# Patient Record
Sex: Male | Born: 1935 | Race: White | Hispanic: No | Marital: Married | Smoking: Former smoker
Health system: Southern US, Community
[De-identification: ages and names within clinical notes are randomized; demographics above are authoritative.]

## PROBLEM LIST (undated history)

## (undated) DIAGNOSIS — N401 Enlarged prostate with lower urinary tract symptoms: Secondary | ICD-10-CM

## (undated) DIAGNOSIS — R350 Frequency of micturition: Secondary | ICD-10-CM

## (undated) DIAGNOSIS — R351 Nocturia: Secondary | ICD-10-CM

## (undated) DIAGNOSIS — R51 Headache: Secondary | ICD-10-CM

## (undated) DIAGNOSIS — N200 Calculus of kidney: Secondary | ICD-10-CM

## (undated) DIAGNOSIS — E785 Hyperlipidemia, unspecified: Secondary | ICD-10-CM

## (undated) DIAGNOSIS — N138 Other obstructive and reflux uropathy: Secondary | ICD-10-CM

## (undated) DIAGNOSIS — R3915 Urgency of urination: Secondary | ICD-10-CM

## (undated) DIAGNOSIS — C450 Mesothelioma of pleura: Secondary | ICD-10-CM

## (undated) DIAGNOSIS — IMO0001 Reserved for inherently not codable concepts without codable children: Secondary | ICD-10-CM

## (undated) DIAGNOSIS — Z85118 Personal history of other malignant neoplasm of bronchus and lung: Secondary | ICD-10-CM

## (undated) DIAGNOSIS — K219 Gastro-esophageal reflux disease without esophagitis: Secondary | ICD-10-CM

## (undated) DIAGNOSIS — G8929 Other chronic pain: Secondary | ICD-10-CM

## (undated) DIAGNOSIS — R0602 Shortness of breath: Secondary | ICD-10-CM

## (undated) HISTORY — DX: Other chronic pain: G89.29

## (undated) HISTORY — DX: Calculus of kidney: N20.0

## (undated) HISTORY — DX: Gastro-esophageal reflux disease without esophagitis: K21.9

## (undated) HISTORY — DX: Headache: R51

---

## 1992-03-23 HISTORY — PX: KNEE ARTHROSCOPY: SUR90

## 1999-04-03 ENCOUNTER — Ambulatory Visit (HOSPITAL_COMMUNITY): Admission: RE | Admit: 1999-04-03 | Discharge: 1999-04-03 | Payer: Self-pay | Admitting: *Deleted

## 2002-02-08 ENCOUNTER — Ambulatory Visit (HOSPITAL_COMMUNITY): Admission: RE | Admit: 2002-02-08 | Discharge: 2002-02-08 | Payer: Self-pay | Admitting: *Deleted

## 2005-08-06 ENCOUNTER — Ambulatory Visit (HOSPITAL_COMMUNITY): Admission: RE | Admit: 2005-08-06 | Discharge: 2005-08-06 | Payer: Self-pay | Admitting: Family Medicine

## 2005-08-12 ENCOUNTER — Ambulatory Visit (HOSPITAL_COMMUNITY): Admission: RE | Admit: 2005-08-12 | Discharge: 2005-08-12 | Payer: Self-pay | Admitting: Family Medicine

## 2005-08-19 ENCOUNTER — Ambulatory Visit (HOSPITAL_COMMUNITY): Admission: RE | Admit: 2005-08-19 | Discharge: 2005-08-19 | Payer: Self-pay | Admitting: Family Medicine

## 2005-08-19 ENCOUNTER — Encounter (INDEPENDENT_AMBULATORY_CARE_PROVIDER_SITE_OTHER): Payer: Self-pay | Admitting: *Deleted

## 2005-09-15 ENCOUNTER — Ambulatory Visit (HOSPITAL_COMMUNITY): Admission: RE | Admit: 2005-09-15 | Discharge: 2005-09-15 | Payer: Self-pay | Admitting: Thoracic Surgery

## 2005-09-18 HISTORY — PX: THORACENTESIS: SHX235

## 2005-09-21 ENCOUNTER — Inpatient Hospital Stay (HOSPITAL_COMMUNITY): Admission: RE | Admit: 2005-09-21 | Discharge: 2005-09-23 | Payer: Self-pay | Admitting: Thoracic Surgery

## 2005-09-21 ENCOUNTER — Encounter (INDEPENDENT_AMBULATORY_CARE_PROVIDER_SITE_OTHER): Payer: Self-pay | Admitting: Specialist

## 2005-09-21 HISTORY — PX: OTHER SURGICAL HISTORY: SHX169

## 2005-09-29 ENCOUNTER — Encounter: Admission: RE | Admit: 2005-09-29 | Discharge: 2005-09-29 | Payer: Self-pay | Admitting: Thoracic Surgery

## 2006-01-21 HISTORY — PX: OTHER SURGICAL HISTORY: SHX169

## 2006-09-13 ENCOUNTER — Ambulatory Visit (HOSPITAL_COMMUNITY): Admission: RE | Admit: 2006-09-13 | Discharge: 2006-09-13 | Payer: Self-pay | Admitting: Gastroenterology

## 2006-09-16 ENCOUNTER — Ambulatory Visit (HOSPITAL_COMMUNITY): Admission: RE | Admit: 2006-09-16 | Discharge: 2006-09-16 | Payer: Self-pay | Admitting: Gastroenterology

## 2006-10-08 ENCOUNTER — Ambulatory Visit (HOSPITAL_COMMUNITY): Admission: RE | Admit: 2006-10-08 | Discharge: 2006-10-08 | Payer: Self-pay | Admitting: Gastroenterology

## 2006-11-22 HISTORY — PX: TRANSURETHRAL RESECTION OF PROSTATE: SHX73

## 2006-12-03 ENCOUNTER — Inpatient Hospital Stay (HOSPITAL_COMMUNITY): Admission: RE | Admit: 2006-12-03 | Discharge: 2006-12-04 | Payer: Self-pay | Admitting: Urology

## 2006-12-03 ENCOUNTER — Encounter (INDEPENDENT_AMBULATORY_CARE_PROVIDER_SITE_OTHER): Payer: Self-pay | Admitting: Urology

## 2007-02-11 ENCOUNTER — Ambulatory Visit: Payer: Self-pay | Admitting: Thoracic Surgery

## 2010-08-05 NOTE — Op Note (Signed)
Chase Marsh, Chase Marsh                 ACCOUNT NO.:  [id number]   MEDICAL RECORD NO.:  [id number]          PATIENT TYPE:  AMB   LOCATION:  ENDO                         FACILITY:  [facility]   PHYSICIAN:  Shirley Friar, MDDATE OF BIRTH:  1931/04/15   DATE OF PROCEDURE:  10/08/2006  DATE OF DISCHARGE:                               OPERATIVE REPORT   PROCEDURE:  Esophagoscopy.   INDICATIONS:  Esophageal ring, dysphagia.   MEDICATIONS:  Fentanyl 50 mcg IV, Versed 4 mg IV, Cetacaine spray.   FINDINGS:  Endoscope was inserted into the oropharynx and esophagus was  intubated.  In the distal esophagus was a nonobstructing,  circumferential, benign-appearing ring at the GE junction.  This ring  was previously noted on previous endoscopy.  A 5.5-cm-long balloon was  inserted and inflated to 15 mm and held for 1 minute.  A larger balloon  was then inserted and inflated to 16.5 mm and 18 mm and each were held  for 1 minute.  A small amount of blood was noted from one edge of the  esophageal ring, consistent with successful dilation.  No immediate  complications were noted.   ASSESSMENT:  Nonobstructing esophageal ring at gastroesophageal  junction, status post dilation to 18 mm with a pneumatic balloon.   PLAN:  1. Continue daily proton pump inhibitor.  2. Follow symptoms and if symptoms recur, then may need repeat      dilation.  3. Follow up in office in 1 month.      Shirley Friar, MD  Electronically Signed     VCS/MEDQ  D:  10/08/2006  T:  10/09/2006  Job:  [id number]   cc:   [name], M.D.  Fax: [phone]

## 2010-08-05 NOTE — Op Note (Signed)
NAMEJOACHIM, CARTON                 ACCOUNT NO.:  [id number]   MEDICAL RECORD NO.:  [id number]          PATIENT TYPE:  INP   LOCATION:  1417                         FACILITY:  [facility]   PHYSICIAN:  Sigmund I. Patsi Sears, M.D.DATE OF BIRTH:  [birth year]   DATE OF PROCEDURE:  12/03/2006  DATE OF DISCHARGE:  12/04/2006                               OPERATIVE REPORT   PREOPERATIVE DIAGNOSIS:  1. Bladder outlet obstruction.  2. Benign prostatic hypertrophy.   POSTOPERATIVE DIAGNOSES:  1. Bladder outlet obstruction.  2. Benign prostatic hypertrophy.   PROCEDURE PERFORMED:  Cystoscopy, transurethral resection of the  prostate.   SURGEON:  Sigmund I. Patsi Sears, M.D.   ASSISTANT:  Melina Schools, M.D.   ANESTHESIA:  Spinal.   INDICATIONS FOR PROCEDURE:  Mr. Liwanag is a 77 year old male with a  history of lower urinary tract symptoms secondary to BPH.  He has been  on maximum medical therapy consisting of Flomax and Avodart.  This  decreased his the AUA symptom score somewhat, but he currently has  deterioration with an AUA symptom score of 17 out of 25.  He desires  operative management and has selected transurethral resection of  prostate.  He has been informed of the risks and benefits of the  procedure and has signed a consent form found on the chart.   DESCRIPTION OF PROCEDURE:  The patient was brought to the operating  room.  He was identified his arm band, consent was verified, and a  preoperative time out was performed.  After the successful induction of  spinal anesthesia, the patient is moved to the dorsal lithotomy  position.  All appropriate pressure points were padded to avoid apraxia  and compartment syndrome.  Perioperative antibiotics were administered  and sequential compression devices were employed.  The perineum was  prepped and draped and the surgeon's were gowned and gloved.  Sissy Hoff  sounds were used to dilate the meatus to 30 [place] sequentially.  The  resectoscopic sheath was passed over an obturator into the bladder and  the bladder was drained.  The resectoscopic working element was then  inserted.  With irrigation of Glycine, we visualized bilateral ureteral  orifices and the remainder of the bladder.  The bladder was  systematically inspected and was free of any erythema, mucosal lesions,  foreign bodies, diverticula, and stones.  It was 1+ trabeculated.   Attention was turned to the prostatic urethra.  The patient had trilobar  hypertrophy.  Systematic transurethral resection of the prostate was  then undertaken.  We began at 6 o'clock and resected the median lobe.  Great care was taken to identify and protect the ureteral orifices at  all times.  This resection was taken down to the capsule.  Great care  was taken to remain proximal to the verumontanum at all times.  We then,  in sequence, resected the left and right lateral lobes down to the  prostatic capsule.  Once the resection was complete, a Toomey syringe  was used to irrigate the chips.  We reinserted the working element and  used cautery to ensure hemostasis.  Once hemostasis was adequate, we  again inspected the verumontanum and it was intact.  The ureteral  orifices were identified and both were seen to efflux clear urine.  We  noted a widely patent prostatic urethra.   At this time, the resectoscope was removed.  A 24-French 30 mL Foley  catheter was inserted transurethrally into the bladder and the balloon  inflated with 30 mL sterile water.  It was placed to straight drain with  gentle traction.  It was hand irrigated until clear.  At this time, the  procedure was terminated.  The patient tolerated the procedure well and  there were no complications.  Dr. Jethro Bolus was the attending  primary and responsible physician and was present and participated in  all aspects of the procedure.     ______________________________  Melina Schools, M.D.       Sigmund I. Patsi Sears, M.D.  Electronically Signed    JR/MEDQ  D:  12/03/2006  T:  12/04/2006  Job:  [id number]

## 2010-08-05 NOTE — Op Note (Signed)
Chase Marsh, Chase Marsh                 ACCOUNT NO.:  [id number]   MEDICAL RECORD NO.:  [id number]          PATIENT TYPE:  AMB   LOCATION:  ENDO                         FACILITY:  [facility]   PHYSICIAN:  Shirley Friar, MDDATE OF BIRTH:  07-05-1931   DATE OF PROCEDURE:  09/16/2006  DATE OF DISCHARGE:                               OPERATIVE REPORT   PROCEDURE:  Upper endoscopy.   SURGEON:  Shirley Friar, M.D.   INDICATIONS:  Reflux, chest pain, dysphagia, abnormal barium swallow.   MEDICATIONS:  Fentanyl 50 mcg IV, Versed 5 mg IV.   FINDINGS:  The endoscope was inserted through oropharynx and the  esophagus was intubated.  The proximal esophagus was normal in  appearance without any mucosal abnormalities.  The endoscope was  advanced further down into the distal esophagus.  There was a thin  circumferential esophageal ring that was nonobstructing.  This was  located at the GE junction.  The endoscope was advanced through this  area into the stomach which was normal in its entirety.  Retroflexion  was done which confirmed normal proximal stomach without any identified  hiatal hernia.  The endoscope was straightened and advanced into the  duodenal bulb and second portion of the duodenum which were both normal.  The endoscope was withdrawn back into the esophagus where this small  nonobstructing ring was again noted.  No esophageal mass or lesion was  seen.  The scope was withdrawn and confirmed the above findings.   ASSESSMENT:  Nonobstructing esophageal ring, otherwise, normal EGD.   PLAN:  1. Continue proton pump inhibitor daily.  2. Obtain last CT of result due to concern on barium swallow for upper      right lateral abnormalities adjacent to      esophagus prior to esophageal dilation.  3. If chest CT shows stable appearance since his surgery with no acute      abnormality, we will plan Savary versus balloon dilation in the      near future.      Shirley Friar,  MD  Electronically Signed     VCS/MEDQ  D:  09/16/2006  T:  09/17/2006  Job:  [id number]   cc:   [name], M.D.  Fax: [phone]   Ines Bloomer, M.D.  [address]  [place]  [place] [address]

## 2010-08-08 NOTE — Discharge Summary (Signed)
Chase Marsh, Chase Marsh                 ACCOUNT NO.:  [id number]   MEDICAL RECORD NO.:  [id number]          PATIENT TYPE:  INP   LOCATION:  3313                         FACILITY:  [facility]   PHYSICIAN:  Ines Bloomer, M.D. DATE OF BIRTH:  January 09, 1932   DATE OF ADMISSION:  09/21/2005  DATE OF DISCHARGE:  09/23/2005                                 DISCHARGE SUMMARY   TENTATIVE DISCHARGE DATE:  July 4 or 5, 2007.   ADMISSION DIAGNOSIS:  Right lower lobe effusion, rule out mesothelioma.   DISCHARGE DIAGNOSES:  1.  Right lower lobe effusion, rule out mesothelioma status post right mets.  2.  Hypercholesterolemia.  3.  Gastroesophageal reflux disease.  4.  Right lower quadrant pain.  5.  CPH.   CONSULTS:  1.  On September 21, 2005 Dr. Jethro Bolus was consulted for urology.  2.  On September 22, 2005 general surgery was consulted.   PROCEDURES:  1.  On September 21, 2005 the patient underwent a  right CT-assisted pleural      biopsy, diaphragmatic biopsy.  2.  Patient underwent an MRI of his pelvis on September 22, 2005.   HISTORY AND PHYSICAL:  Patient is a 77 year old Caucasian male presenting  with right lower quadrant pain to his medical doctor, Dr. [name], and  underwent abdominal CT that shows effusion of the right lower lobe.  He had  thoracentesis that shows epithelial cells suspicious for retrocelioma.  Pulmonary function tests showed FVC of 3.075, FEV1 of 2.57.  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  A CT scan and PET scan were done, which showed an irregular  lesion of the small effusion in the right lower lobe and a low-grade  activity at the right lung.  Quit smoking in May of 2003.  Patient denies  hemoptysis, fever, chills. Weight has been stable.  Patient mentioned he is  having a burning pain in the right lower quadrant.  Patient complains of  this burning pain for the past 3 weeks, slightly relieved with  urination,  nocturia x3-4, much increased over the last several days with coincidental  right lower quadrant pain of unknown etiology.  It was best thought the  patient undergo a right VAS/pleural/diaphragmatic biopsy.  Pulmonary risks  and benefits were explained to the patient.  He has agreed to continue.   HOSPITAL COURSE:  Postoperatively the patient has been stable.  Postop day  #1 patient's vital signs are stable.  His IV fluids and PCA were  discontinued.  The patient's chest tube was discontinued.  Dr. Edwyna Shell will  plan his discharge around Wednesday or Thursday, provided he remain stable  and he is breathing without any difficulty.   Dr. Patsi Sears from urology saw the patient for his history of dysuria.  The  patient was started on Pyridium and Bactrim DS.  Patient will undergo an MRI  of his pelvis prior to discharge.  The patient will follow up with Dr.  Patsi Sears in his office after discharge.  General surgery has also evaluated the patient due to his lower quadrant  pelvic pain.  They agree that it might be prostate in origin, and to follow  up with Dr. Patsi Sears.   The patient's pathology showed preliminarily mesothelioma.  Final diagnosis  is still pending at this time.  Patient will follow up with Dr. Edwyna Shell in a  week after discharge to assist in further care.   PHYSICAL EXAMINATION:  VITALS:  Temperature 98.3, heart rate 80,  respirations 16, blood pressure 100/46.  O2 sat is 95% on 2 L.  GENERAL:  No acute distress.  NECK:  No adenopathies.  CARDIOVASCULAR:  Regular rate and rhythm.  RESPIRATIONS:  Clear to auscultation bilaterally.  ABDOMEN:  Positive bowel sounds.  Localized pain, tenderness of the  suprapubic area.  No masses, no organomegaly.   STUDIES:  Patient remains hemodynamically stable.  His chest x-ray is also  stable.   DISCHARGE CONDITION:  Stable.   DISPOSITION:  Patient will be discharged to home.   MEDICATIONS:  Include:  1.  Tylox 1-2  tabs every 4 hours p.r.n.  2.  Flomax 0.5 mg p.o. daily.  3.  Zetia 5 mg p.o. daily.  4.  Lipitor 5 mg p.o. daily.  5.  Aspirin 325 mg p.o. daily.  6.  Prilosec 20 mg p.o. daily.  7.  Vitamin as needed.  8.  Bactrim DS 1 tab b.i.d.  Date of dosage will be turned at patient's      discharge.  9.  Pyridium 100 mg p.o. b.i.d.  Days of dosage will be assessed at the      patient's time of discharge.   Bactrim and Pyridium are up to Dr. Patsi Sears from urology.   INSTRUCTIONS:  The patient is instructed to follow a low-fat, low-salt diet.  To do no driving or heavy lifting greater than 10 pounds, 3 weeks.  The  patient is to ambulate 3-4 times daily and continue breathing exercises.  Patient may shower and clean will mild soap and water.  Patient's colostomy  problems such as incision drainage, redness, temperature greater than 101.5.   FOLLOWUP:  Patient has a follow-up appointment with Dr. Edwyna Shell on September 30, 2005 at 4:00.  Prior to seeing Dr.  Edwyna Shell, he will have a chest x-ray  taken.  The patient will follow up with Dr. Patsi Sears as outpatient to  further assess his history of benign prostatic hypertrophy.      Constance Holster, PA    ______________________________  Ines Bloomer, M.D.    JMW/MEDQ  D:  09/22/2005  T:  09/22/2005  Job:  [id number]   cc:   Lynelle Smoke I. Patsi Sears, M.D.  Fax: [phone]   [name], M.D.  Fax: [phone]

## 2010-08-08 NOTE — H&P (Signed)
Chase Marsh, Chase Marsh                 ACCOUNT NO.:  [id number]   MEDICAL RECORD NO.:  [id number]          PATIENT TYPE:  INP   LOCATION:  NA                           FACILITY:  [facility]   PHYSICIAN:  Ines Bloomer, M.D. DATE OF BIRTH:  07-12-1931   DATE OF ADMISSION:  DATE OF DISCHARGE:                                HISTORY & PHYSICAL   CHIEF COMPLAINT:  Right lower quadrant pain.   HISTORY OF PRESENT ILLNESS:  This patient, a 77 year old Caucasian male,  presented with right lower quadrant pain to his medical doctor, Dr. [name], and underwent an abdominal CT that showed an effusion in the right  lower lobe.  He had a thoracentesis which showed mesothelial cells  suspicious for mesothelioma.  His pulmonary function tests showed an FVC of  3.07, FEV1 of 2.57.  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  A CT scan and a PET  scan was done which showed an irregular lesion and a small effusion in the  right lower lobe and just low-grade activity in the right lung.  He quit  smoking in May 2003.  He denies hemoptysis, fever, chills.  Weight has been  stable.  He mentioned he is having a burning pain in the right lower  quadrant.   PAST MEDICAL HISTORY:  He is allergic to PENICILLIN.   Medications :  1.  Flomax 0.4 mg daily.  2.  Zetia 5 mg daily.  3.  Lipitor 5 mg daily.  4.  Aspirin 325 mg daily.  5.  Prilosec 20 mg daily.   He has hypercholesterolemia, reflux.   FAMILY HISTORY:  Noncontributory.   SOCIAL HISTORY:  Negative.  Two children.  He is retired.  Quit smoking in  May 2002.  Does not drink alcohol on a regular basis.   REVIEW OF SYSTEMS:  He is 168 pounds.  He is 5 feet 11.  Denies any angina  or atrial fibrillation.  PULMONARY:  He has had some chronic bronchitis and  asthma.  GI:  He has reflux.  Also has this right lower quadrant pain but no  nausea, vomiting, constipation, vomiting, or  diarrhea.  Noted pain with  certain types of food.  GU:  He has frequent urination and dysuria.  He is  on Flomax for benign prostatic hypertrophy.  VASCULAR:  No claudication,  DVT, or TIAs.  NEUROLOGIC:  No headache, blackout, or seizures.  MUSCULOSKELETAL:  No arthritis, joint pain.  PSYCHIATRIC:  No psychiatric  illnesses.  No depression or anxiety. EYE/ENT:  No change in his eyesight or  hearing.  HEMATOLOGICAL:  No problems with anemia or clotting.   PHYSICAL EXAMINATION:  GENERAL:  He is a well-developed Caucasian male in no  acute distress.  VITAL SIGNS:  His blood pressure is 108/60, pulse 60, respirations 18,  saturations 98%.  HEAD:  Atraumatic.  Eyes:  Pupils equal and reactive to light and  accommodation.  Extraocular movements are normal.  Ears:  Tympanic membranes  are intact.  Nose:  There is no septal deviation.  Throat is without lesion.  NECK:  Supple with no thyromegaly.  No supraclavicular or axillary  adenopathy.  CHEST:  Clear to auscultation and percussion.  HEART:  Regular sinus rhythm, no murmurs.  ABDOMEN:  Soft except in the right lower quadrant just near the inguinal  ligament there is some point tenderness there and a questionable possibility  of hernia in that area.  He has some burning, stinging pain in that area.  Bowel sounds are normal.  EXTREMITIES:  Pulses are 2+.  There is no clubbing or edema.  NEUROLOGIC:  He is oriented x3.  Cranial nerves II-XII are intact.  Sensory  and motor are intact.  SKIN:  Without lesion.   IMPRESSION:  1.  Right lower lobe effusion, rule out mesothelioma.  2.  Hypercholesterolemia.  3.  Gastroesophageal reflux disease.  4.  Right lower quadrant pain, rule out a hiatal hernia.   PLAN:  Right VATS, pleural biopsy, drainage of pleural effusion.           ______________________________  Ines Bloomer, M.D.     DPB/MEDQ  D:  09/18/2005  T:  09/18/2005  Job:  [id number]

## 2010-08-08 NOTE — Op Note (Signed)
[name], Chase Marsh                 ACCOUNT NO.:  [id number]   MEDICAL RECORD NO.:  [id number]          PATIENT TYPE:  INP   LOCATION:  2899                         FACILITY:  [facility]   PHYSICIAN:  Ines Bloomer, M.D. DATE OF BIRTH:  May 20, 1931   DATE OF PROCEDURE:  09/21/2005  DATE OF DISCHARGE:                                 OPERATIVE REPORT   PREOPERATIVE DIAGNOSIS:  Right pleural effusion with pleural plaquing, rule  out mesothelioma.   POSTOPERATIVE DIAGNOSIS:  Right pleural effusion with pleural plaquing, rule  out mesothelioma.   OPERATION:  Right videoassisted thoracic surgery, pleural biopsy,  diaphragmatic biopsy.   SURGEON:  Ines Bloomer, M.D.   FIRST ASSISTANT:  Coral Ceo, P.A.   ANESTHESIA:  General.   After percutaneous insertion of all monitoring lines, the patient was turned  to the right lateral thoracotomy position, was prepped and draped in the  usual sterile manner.  Trocar site was made in the seventh intercostal space  in the mid axillary line and the 0-degree scope was inserted and biopsies of  diaphragm and pleura were done as well as pictures of the diaphragm and  lungs and pleura were done.  Frozen section revealed a malignant process,  questionable for mesothelioma.  A chest tube was placed through the trocar  site and tied in place with zero silk.  The chest was closed.  A Marcaine  block was done in the usual fashion.  The patient was returned to the  recovery room in stable condition.           ______________________________  Ines Bloomer, M.D.     DPB/MEDQ  D:  09/21/2005  T:  09/21/2005  Job:  [id number]

## 2010-08-08 NOTE — Consult Note (Signed)
Chase Marsh, Chase Marsh                 ACCOUNT NO.:  [id number]   MEDICAL RECORD NO.:  [id number]          PATIENT TYPE:  INP   LOCATION:  3313                         FACILITY:  [facility]   PHYSICIAN:  Sigmund I. Patsi Sears, M.D.DATE OF BIRTH:  26-Sep-1931   DATE OF CONSULTATION:  09/21/2005  DATE OF DISCHARGE:                                   CONSULTATION   REASON FOR CONSULTATION:  This is a 77 year old, married, white male seen as  hospital consultation because of complaints of 3 weeks of urinary burning,  slightly relieved with urination, nocturia x3-4 much increased over the last  several days with coincidental right lower quadrant pain of unknown  etiology.  The patient is currently status post VATS procedure with  diagnosis of right lower lobe mesothelioma.   PAST MEDICAL HISTORY:  1.  GERD.  2.  BPH.  3.  Elevated cholesterol.  4.  Mesothelioma.  5.  Right upper quadrant pain.   REVIEW OF SYSTEMS:  Asbestos exposure.  No kidney stones.  No gross  hematuria.  No fevers or chills.  No GI complaints, no nausea, vomiting,  constipation or diarrhea.  She had no gross hematuria, no bloody bowel  movements.  There is no history of anemia or clotting abnormality.   MEDICATIONS:  1.  Flomax 0.4 mg p.o. per day.  2.  Zetia 5 mg one p.o. daily.  3.  Lipitor 5 mg one p.o. per day.  4.  Aspirin 325 mg one p.o. per day.  5.  Prilosec 20 mg one p.o. per day.   PHYSICAL EXAMINATION:  GENERAL:  A well-developed, well-nourished, white  male in bed with right-sided chest tube in place in no acute distress.  The  patient has been up walking around.  VITAL SIGNS:  Blood pressure 133/70, heart rate 86, O2 saturations 96%.  HEENT:  PERRLA.  NECK:  Supple.  Nontender.  CHEST:  Per Dr. Scheryl Darter examination.  ABDOMEN:  Soft, bowel sounds without organomegaly or masses.  There is right  lower quadrant pain to deep palpation, but there was no rebound.  There was  no right upper quadrant pain to  palpation.  There is no flank pain.  GENITALIA:  Normal penis, normal urethra, normal glands.  The scrotum is  normal.  Testicles measure 4 x 4 cm and nontender.  They are equal  bilaterally.  The epididymis is normal bilaterally.  RECTAL:  She has normal sphincter tone.  Prostate is 4+, benign, no masses  and no blood.  EXTREMITIES:  No cyanosis or edema.  NEUROLOGIC:  Normal reflexes.   ASSESSMENT:  A 3-week history of dysuria with a 1-year history of benign  prostatic hypertrophy on Flomax.  Physical examination does show some right  lower quadrant pain to deep palpation and is unknown if this is a spasm or  even related to his bladder.  The patient is eating well and has no  gastrointestinal complaints.  I have reviewed the positron-emission  tomography scan tonight and will re-review it tomorrow.  The positivity  found on positron-emission tomography scan of the patient's  prostate may  well represent no true disease process.  The patient may have prostatitis in  view of his dysuria and I will advise treating him with Septra double  strength on p.o. b.i.d. x3 weeks as well as Pyridium plus one p.o. b.i.d.  for urinary burning and spasm.   RECOMMENDATIONS:  He will need followup in the office and I have given a  business card for him to call the office and make an appointment.  He  indicates that he may be going to [facility] further [facility] and this  will cause his prostate evaluation to be delayed.      Sigmund I. Patsi Sears, M.D.  Electronically Signed     SIT/MEDQ  D:  09/21/2005  T:  09/21/2005  Job:  [id number]   cc:   Buren Kos, M.D.  Fax: [phone]   Colleen Can. Deborah Chalk, M.D.  Fax: [phone]

## 2010-10-09 ENCOUNTER — Other Ambulatory Visit: Payer: Self-pay | Admitting: Family Medicine

## 2010-10-13 ENCOUNTER — Ambulatory Visit
Admission: RE | Admit: 2010-10-13 | Discharge: 2010-10-13 | Disposition: A | Discharge status: Home / Self Care | Payer: Medicare Other | Source: Ambulatory Visit | Attending: Family Medicine | Admitting: Family Medicine

## 2011-01-02 LAB — CBC
HCT: 33.6 — ABNORMAL LOW
HCT: 36 — ABNORMAL LOW
Hemoglobin: 11.8 — ABNORMAL LOW
Hemoglobin: 12.5 — ABNORMAL LOW
Hemoglobin: 14.5
MCHC: 34.9
MCV: 88.6
Platelets: 128 — ABNORMAL LOW
Platelets: 154
RBC: 3.85 — ABNORMAL LOW
RBC: 4.73
RDW: 13.6
RDW: 13.7
WBC: 5.2

## 2011-01-02 LAB — BASIC METABOLIC PANEL
BUN: 14
BUN: 9
CO2: 31
Calcium: 8.3 — ABNORMAL LOW
Chloride: 103
Creatinine, Ser: 0.76
GFR calc non Af Amer: >60
Glucose, Bld: 94
Potassium: 3.4 — ABNORMAL LOW
Potassium: 3.6
Sodium: 138

## 2011-01-02 LAB — COMPREHENSIVE METABOLIC PANEL
ALT: 27
AST: 26
Albumin: 3.9
Alkaline Phosphatase: 81
BUN: 16
GFR calc non Af Amer: >60
Potassium: 4.5
Sodium: 140
Total Bilirubin: 0.7

## 2011-01-02 LAB — APTT: aPTT: 33

## 2011-01-02 LAB — PROTIME-INR: Prothrombin Time: 13.5

## 2011-03-13 ENCOUNTER — Other Ambulatory Visit: Payer: Self-pay | Admitting: Urology

## 2011-03-31 ENCOUNTER — Encounter (HOSPITAL_BASED_OUTPATIENT_CLINIC_OR_DEPARTMENT_OTHER): Payer: Self-pay | Admitting: *Deleted

## 2011-03-31 NOTE — Progress Notes (Signed)
NPO AFTER MN. ARRIVES AT 0730. NEEDS HG. EKG TO BE FAXED FROM DR [name].  LAST VISIT NOTE AND CT FROM DR CRAWFORD AT [facility]  [phone]), PT HAS COPY OF CT AS WELL.  WILL TAKE PRILOSEC AM OF SURG. W/ SIP OF WATER. REVIEWED [facility] GUIDELINES, WILL BRING MED.

## 2011-04-01 ENCOUNTER — Encounter (HOSPITAL_BASED_OUTPATIENT_CLINIC_OR_DEPARTMENT_OTHER): Payer: Self-pay | Admitting: *Deleted

## 2011-04-01 NOTE — H&P (Signed)
Urology Admission H&P  Chief Complaint: The patient is a 78 year old male, with significant lower urinary tract symptoms, failing Vesicare. The patient is status post TURP in 2008 for BPH and prostatitis. He had resolution of his prostate symptom score seated from 11-4, but currently, his eye BSS is 19. The patient complains of suprapubic pressure, nocturia. He denies gross hematuria flank pain frequency incontinence dysuria fever chills. Note the patient has a significant history of mesothelioma, followed at [facility]. The patient underwent cystoscopy on 12 4, with the finding of visual obstruction of prostate from a large intravesical median lobe. He is now for gyrus TURP.  History of Present Illness:Bladder outlet obstrucion as noted above, 2ndary enlarged median lobe.  Past Medical History  Diagnosis Date  . Bladder outlet obstruction   . BPH (benign prostatic hypertrophy) with urinary obstruction   . Hyperlipidemia   . History of lung cancer RIGHT LOWER LOBE- MESOTHELIOMA---  2007  . Frequent urination   . Urgency of urination   . Nocturia   . Shortness of breath on exertion   . Patient able to exercise   . Malignant mesothelioma of pleura ONCOLOGIST-  DR Okey Dupre AT [facility]-----  DX  JULY 2007--  S/P CHEMORADIATION , LUNG SURG. RIGHT LOWER LUBE    RECURRENCE 2010 W/ CHEMO THERAPY   Past Surgical History  Procedure Date  . Transurethral resection of prostate SEPT  2008  . Knee arthroscopy 1994    RIGHT  . Right vat/ pleural bx/ diaphragmatic bx/ drainage pleural effusion 09-21-2005  . Thoracentesis 09-18-2005    RIGHT LOWER LOBE EFFUSION  . Right lung lining removed NOV  2007    Home Medications:  No prescriptions prior to admission   Allergies:  Allergies  Allergen Reactions  . Penicillins Rash    History reviewed. No pertinent family history. Social History:  reports that he quit smoking about 9 years ago. His smoking use included Cigarettes. He quit after 50 years of  use. He does not have any smokeless tobacco history on file. His alcohol and drug histories not on file.  Review of Systems  Constitutional: Negative.   HENT: Negative.   Eyes: Negative.   Respiratory: Negative.   Cardiovascular: Negative.   Gastrointestinal: Negative.   Genitourinary: Positive for urgency and frequency. Negative for dysuria, hematuria and flank pain.  Musculoskeletal: Negative.   Skin: Negative.   Neurological: Negative.   Endo/Heme/Allergies: Negative.   Psychiatric/Behavioral: Negative.     Physical Exam:  Vital signs in last 24 hours:   Physical Exam  Nursing note and vitals reviewed. Constitutional: He appears well-developed and well-nourished. No distress.  HENT:  Head: Normocephalic.  Eyes: Pupils are equal, round, and reactive to light.  Neck: Normal range of motion.  Cardiovascular: Normal rate.   Respiratory: Effort normal.  GI: Soft.  Genitourinary: Rectum normal, prostate normal and penis normal.  Musculoskeletal: Normal range of motion.  Neurological: He is alert.  Skin: Skin is warm.    Laboratory Data:  No results found for this or any previous visit (from the past 24 hour(s)). No results found for this or any previous visit (from the past 240 hour(s)). Creatinine: Impression/Assessment:  BPH 2ndary median lobe hypertrophy  Plan:  TURP median lobe  Kateena Degroote I 04/01/2011, 6:34 PM

## 2011-04-02 ENCOUNTER — Encounter (HOSPITAL_BASED_OUTPATIENT_CLINIC_OR_DEPARTMENT_OTHER): Payer: Self-pay | Admitting: Anesthesiology

## 2011-04-02 ENCOUNTER — Other Ambulatory Visit: Payer: Self-pay | Admitting: Urology

## 2011-04-02 ENCOUNTER — Ambulatory Visit (HOSPITAL_BASED_OUTPATIENT_CLINIC_OR_DEPARTMENT_OTHER): Payer: Medicare Other | Admitting: Anesthesiology

## 2011-04-02 ENCOUNTER — Encounter (HOSPITAL_BASED_OUTPATIENT_CLINIC_OR_DEPARTMENT_OTHER): Payer: Self-pay | Admitting: *Deleted

## 2011-04-02 ENCOUNTER — Encounter (HOSPITAL_BASED_OUTPATIENT_CLINIC_OR_DEPARTMENT_OTHER)
Admission: RE | Disposition: A | Discharge status: Home / Self Care | Payer: Self-pay | Source: Ambulatory Visit | Attending: Urology

## 2011-04-02 ENCOUNTER — Ambulatory Visit (HOSPITAL_BASED_OUTPATIENT_CLINIC_OR_DEPARTMENT_OTHER)
Admission: RE | Admit: 2011-04-02 | Discharge: 2011-04-03 | Disposition: A | Discharge status: Home / Self Care | Payer: Medicare Other | Source: Ambulatory Visit | Attending: Urology | Admitting: Urology

## 2011-04-02 DIAGNOSIS — N320 Bladder-neck obstruction: Secondary | ICD-10-CM | POA: Insufficient documentation

## 2011-04-02 DIAGNOSIS — N138 Other obstructive and reflux uropathy: Secondary | ICD-10-CM | POA: Insufficient documentation

## 2011-04-02 DIAGNOSIS — N400 Enlarged prostate without lower urinary tract symptoms: Secondary | ICD-10-CM

## 2011-04-02 DIAGNOSIS — Z85118 Personal history of other malignant neoplasm of bronchus and lung: Secondary | ICD-10-CM | POA: Insufficient documentation

## 2011-04-02 DIAGNOSIS — Z902 Acquired absence of lung [part of]: Secondary | ICD-10-CM | POA: Insufficient documentation

## 2011-04-02 DIAGNOSIS — N401 Enlarged prostate with lower urinary tract symptoms: Secondary | ICD-10-CM | POA: Insufficient documentation

## 2011-04-02 DIAGNOSIS — R0602 Shortness of breath: Secondary | ICD-10-CM | POA: Insufficient documentation

## 2011-04-02 DIAGNOSIS — E785 Hyperlipidemia, unspecified: Secondary | ICD-10-CM | POA: Insufficient documentation

## 2011-04-02 HISTORY — DX: Reserved for inherently not codable concepts without codable children: IMO0001

## 2011-04-02 HISTORY — PX: TRANSURETHRAL RESECTION OF PROSTATE: SHX73

## 2011-04-02 HISTORY — DX: Nocturia: R35.1

## 2011-04-02 HISTORY — DX: Mesothelioma of pleura: C45.0

## 2011-04-02 HISTORY — DX: Other obstructive and reflux uropathy: N40.1

## 2011-04-02 HISTORY — DX: Urgency of urination: R39.15

## 2011-04-02 HISTORY — DX: Other obstructive and reflux uropathy: N13.8

## 2011-04-02 HISTORY — DX: Hyperlipidemia, unspecified: E78.5

## 2011-04-02 HISTORY — DX: Personal history of other malignant neoplasm of bronchus and lung: Z85.118

## 2011-04-02 HISTORY — DX: Frequency of micturition: R35.0

## 2011-04-02 HISTORY — DX: Shortness of breath: R06.02

## 2011-04-02 SURGERY — TRANSURETHRAL RESECTION OF THE PROSTATE WITH GYRUS INSTRUMENTS
Anesthesia: General | Site: Prostate | Laterality: Left | Wound class: Clean Contaminated

## 2011-04-02 MED ORDER — LACTATED RINGERS IV SOLN: INTRAVENOUS | Status: DC

## 2011-04-02 MED ORDER — ACETAMINOPHEN 10 MG/ML IV SOLN
INTRAVENOUS | Status: DC | PRN
  Administered 2011-04-02: 1000 mg via INTRAVENOUS

## 2011-04-02 MED ORDER — LACTATED RINGERS IV SOLN
INTRAVENOUS | Status: DC
  Administered 2011-04-02 (×3): via INTRAVENOUS

## 2011-04-02 MED ORDER — FENTANYL CITRATE 0.05 MG/ML IJ SOLN
INTRAMUSCULAR | Status: DC | PRN
  Administered 2011-04-02: 50 ug via INTRAVENOUS
  Administered 2011-04-02 (×2): 25 ug via INTRAVENOUS

## 2011-04-02 MED ORDER — KETOROLAC TROMETHAMINE 30 MG/ML IJ SOLN
INTRAMUSCULAR | Status: DC | PRN
  Administered 2011-04-02: 15 mg via INTRAVENOUS

## 2011-04-02 MED ORDER — PROPOFOL 10 MG/ML IV EMUL
INTRAVENOUS | Status: DC | PRN
  Administered 2011-04-02: 180 mg via INTRAVENOUS

## 2011-04-02 MED ORDER — CIPROFLOXACIN HCL 500 MG PO TABS
500.0000 mg | ORAL_TABLET | Freq: Two times a day (BID) | ORAL | Status: DC
  Administered 2011-04-02 (×2): 500 mg via ORAL

## 2011-04-02 MED ORDER — HYDROMORPHONE HCL PF 1 MG/ML IJ SOLN: 0.5000 mg | INTRAMUSCULAR | Status: DC | PRN

## 2011-04-02 MED ORDER — BACITRACIN-NEOMYCIN-POLYMYXIN 400-5-5000 EX OINT: 1.0000 "application " | TOPICAL_OINTMENT | Freq: Three times a day (TID) | CUTANEOUS | Status: DC | PRN

## 2011-04-02 MED ORDER — DIPHENHYDRAMINE HCL 12.5 MG/5ML PO ELIX: 12.5000 mg | ORAL_SOLUTION | Freq: Four times a day (QID) | ORAL | Status: DC | PRN

## 2011-04-02 MED ORDER — PROMETHAZINE HCL 25 MG/ML IJ SOLN: 6.2500 mg | INTRAMUSCULAR | Status: DC | PRN

## 2011-04-02 MED ORDER — FENTANYL CITRATE 0.05 MG/ML IJ SOLN
25.0000 ug | INTRAMUSCULAR | Status: DC | PRN
  Administered 2011-04-02 (×4): 25 ug via INTRAVENOUS

## 2011-04-02 MED ORDER — ONDANSETRON HCL 4 MG/2ML IJ SOLN: 4.0000 mg | INTRAMUSCULAR | Status: DC | PRN

## 2011-04-02 MED ORDER — PANTOPRAZOLE SODIUM 40 MG PO TBEC
40.0000 mg | DELAYED_RELEASE_TABLET | Freq: Every day | ORAL | Status: DC
  Administered 2011-04-02: 40 mg via ORAL

## 2011-04-02 MED ORDER — TAMSULOSIN HCL 0.4 MG PO CAPS
0.4000 mg | ORAL_CAPSULE | Freq: Every day | ORAL | Status: DC
  Administered 2011-04-02: 0.4 mg via ORAL

## 2011-04-02 MED ORDER — ONDANSETRON HCL 4 MG/2ML IJ SOLN
INTRAMUSCULAR | Status: DC | PRN
  Administered 2011-04-02: 4 mg via INTRAVENOUS

## 2011-04-02 MED ORDER — EPHEDRINE SULFATE 50 MG/ML IJ SOLN
INTRAMUSCULAR | Status: DC | PRN
  Administered 2011-04-02 (×3): 10 mg via INTRAVENOUS

## 2011-04-02 MED ORDER — ONE-DAILY MULTI VITAMINS PO TABS: 1.0000 | ORAL_TABLET | Freq: Every day | ORAL | Status: DC

## 2011-04-02 MED ORDER — MEPERIDINE HCL 25 MG/ML IJ SOLN: 6.2500 mg | INTRAMUSCULAR | Status: DC | PRN

## 2011-04-02 MED ORDER — POLYETHYLENE GLYCOL 3350 17 G PO PACK: 17.0000 g | PACK | Freq: Every day | ORAL | Status: DC | PRN

## 2011-04-02 MED ORDER — HYOSCYAMINE SULFATE 0.125 MG SL SUBL
0.1250 mg | SUBLINGUAL_TABLET | SUBLINGUAL | Status: DC | PRN
  Administered 2011-04-02: 0.125 mg via ORAL

## 2011-04-02 MED ORDER — LIDOCAINE HCL (CARDIAC) 20 MG/ML IV SOLN
INTRAVENOUS | Status: DC | PRN
  Administered 2011-04-02: 80 mg via INTRAVENOUS

## 2011-04-02 MED ORDER — DEXTROSE-NACL 5-0.45 % IV SOLN
INTRAVENOUS | Status: DC
  Administered 2011-04-02: 14:00:00 via INTRAVENOUS

## 2011-04-02 MED ORDER — GLYCOPYRROLATE 0.2 MG/ML IJ SOLN
INTRAMUSCULAR | Status: DC | PRN
  Administered 2011-04-02: 0.2 mg via INTRAVENOUS

## 2011-04-02 MED ORDER — SIMVASTATIN 20 MG PO TABS
20.0000 mg | ORAL_TABLET | Freq: Every day | ORAL | Status: DC
  Administered 2011-04-02: 20 mg via ORAL

## 2011-04-02 MED ORDER — BELLADONNA ALKALOIDS-OPIUM 16.2-60 MG RE SUPP
RECTAL | Status: DC | PRN
  Administered 2011-04-02: 1 via RECTAL

## 2011-04-02 MED ORDER — ZOLPIDEM TARTRATE 5 MG PO TABS: 5.0000 mg | ORAL_TABLET | Freq: Every evening | ORAL | Status: DC | PRN

## 2011-04-02 MED ORDER — CIPROFLOXACIN IN D5W 400 MG/200ML IV SOLN
400.0000 mg | INTRAVENOUS | Status: AC
  Administered 2011-04-02: 400 mg via INTRAVENOUS

## 2011-04-02 MED ORDER — DIPHENHYDRAMINE HCL 50 MG/ML IJ SOLN: 12.5000 mg | Freq: Four times a day (QID) | INTRAMUSCULAR | Status: DC | PRN

## 2011-04-02 MED ORDER — OXYCODONE-ACETAMINOPHEN 5-325 MG PO TABS
1.0000 | ORAL_TABLET | ORAL | Status: DC | PRN
  Administered 2011-04-02 (×2): 2 via ORAL

## 2011-04-02 MED ORDER — ACETAMINOPHEN 10 MG/ML IV SOLN
1000.0000 mg | Freq: Four times a day (QID) | INTRAVENOUS | Status: DC
  Administered 2011-04-02 (×2): 1000 mg via INTRAVENOUS

## 2011-04-02 MED ORDER — SODIUM CHLORIDE 0.9 % IR SOLN
Status: DC | PRN
  Administered 2011-04-02: 6000 mL

## 2011-04-02 SURGICAL SUPPLY — 44 items
BAG DRAIN URO-CYSTO SKYTR STRL (DRAIN) ×2 IMPLANT
BAG DRN ANRFLXCHMBR STRAP LEK (BAG)
BAG DRN UROCATH (DRAIN) ×1
BAG URINE DRAINAGE (UROLOGICAL SUPPLIES) IMPLANT
BAG URINE LEG 19OZ MD ST LTX (BAG) IMPLANT
BLADE SURG 15 STRL LF DISP TIS (BLADE) IMPLANT
BLADE SURG 15 STRL SS (BLADE)
BOOTIES KNEE HIGH SLOAN (MISCELLANEOUS) ×2 IMPLANT
CANISTER SUCT LVC 12 LTR MEDI- (MISCELLANEOUS) ×8 IMPLANT
CATH AINSWORTH 30CC 24FR (CATHETERS) IMPLANT
CATH FOLEY 2WAY SLVR  5CC 14FR (CATHETERS)
CATH FOLEY 2WAY SLVR  5CC 20FR (CATHETERS)
CATH FOLEY 2WAY SLVR 5CC 14FR (CATHETERS) IMPLANT
CATH FOLEY 2WAY SLVR 5CC 20FR (CATHETERS) IMPLANT
CATH HEMA 3WAY 30CC 24FR COUDE (CATHETERS) IMPLANT
CATH HEMA 3WAY 30CC 24FR RND (CATHETERS) IMPLANT
CATH SIMPLASTIC 24 30ML (CATHETERS) IMPLANT
CLOTH BEACON ORANGE TIMEOUT ST (SAFETY) ×2 IMPLANT
DRAPE CAMERA CLOSED 9X96 (DRAPES) ×2 IMPLANT
ELECT BUTTON HF 24-28F 2 30DE (ELECTRODE) ×2 IMPLANT
ELECT LOOP HF 24-28F (CUTTING LOOP) IMPLANT
ELECT LOOP HF 26F 30D .35MM (CUTTING LOOP) IMPLANT
ELECT LOOP MED HF 24F 12D CBL (CLIP) ×1 IMPLANT
ELECT NEEDLE 45D HF 24-28F 12D (CUTTING LOOP) IMPLANT
ELECT REM PT RETURN 9FT ADLT (ELECTROSURGICAL) ×2
ELECTRODE REM PT RTRN 9FT ADLT (ELECTROSURGICAL) ×1 IMPLANT
GLOVE BIO SURGEON STRL SZ 6.5 (GLOVE) ×1 IMPLANT
GLOVE BIO SURGEON STRL SZ7.5 (GLOVE) ×2 IMPLANT
GLOVE BIOGEL PI IND STRL 6.5 (GLOVE) IMPLANT
GLOVE BIOGEL PI INDICATOR 6.5 (GLOVE) ×1
GLOVE ECLIPSE 6.0 STRL STRAW (GLOVE) ×1 IMPLANT
GOWN PREVENTION PLUS LG XLONG (DISPOSABLE) ×3 IMPLANT
GOWN STRL REIN XL XLG (GOWN DISPOSABLE) ×2 IMPLANT
HOLDER FOLEY CATH W/STRAP (MISCELLANEOUS) IMPLANT
KIT ASPIRATION TUBING (SET/KITS/TRAYS/PACK) ×2 IMPLANT
LOOP CUTTING 24FR OLYMPUS (CUTTING LOOP) IMPLANT
NS IRRIG 500ML POUR BTL (IV SOLUTION) ×2 IMPLANT
PACK CYSTOSCOPY (CUSTOM PROCEDURE TRAY) ×2 IMPLANT
PLUG CATH AND CAP STER (CATHETERS) IMPLANT
SUT ETHILON 3 0 PS 1 (SUTURE) IMPLANT
SUT SILK 0 TIES 10X30 (SUTURE) IMPLANT
SYR 30ML LL (SYRINGE) IMPLANT
SYRINGE IRR TOOMEY STRL 70CC (SYRINGE) ×2 IMPLANT
WATER STERILE IRR 500ML POUR (IV SOLUTION) ×1 IMPLANT

## 2011-04-02 NOTE — Interval H&P Note (Signed)
History and Physical Interval Note:  04/02/2011 8:36 AM  Chase Marsh  has presented today for surgery, with the diagnosis of Benign Prostatic Hypertrophy  The various methods of treatment have been discussed with the patient and family. After consideration of risks, benefits and other options for treatment, the patient has consented to  Procedure(s): TRANSURETHRAL RESECTION OF THE PROSTATE WITH GYRUS INSTRUMENTS as a surgical intervention .  The patients' history has been reviewed, patient examined, no change in status, stable for surgery.  I have reviewed the patients' chart and labs.  Questions were answered to the patient's satisfaction.     Jethro Bolus I

## 2011-04-02 NOTE — Anesthesia Preprocedure Evaluation (Addendum)
Anesthesia Evaluation  Patient identified by MRN, date of birth, ID band Patient awake    Reviewed: Allergy & Precautions, H&P , NPO status , Patient's Chart, lab work & pertinent test results  Airway Mallampati: II TM Distance: >3 FB Neck ROM: Full    Dental No notable dental hx. (+) Upper Dentures   Pulmonary neg pulmonary ROS, shortness of breath (h/o lung CA s/p chemo and XRT),  Mesothelioma  clear to auscultation  Pulmonary exam normal       Cardiovascular neg cardio ROS Regular Normal    Neuro/Psych Negative Neurological ROS  Negative Psych ROS   GI/Hepatic negative GI ROS, Neg liver ROS,   Endo/Other  Negative Endocrine ROS  Renal/GU negative Renal ROS  Genitourinary negative   Musculoskeletal negative musculoskeletal ROS (+)   Abdominal   Peds negative pediatric ROS (+)  Hematology negative hematology ROS (+)   Anesthesia Other Findings   Reproductive/Obstetrics negative OB ROS                          Anesthesia Physical Anesthesia Plan  ASA: II  Anesthesia Plan: General   Post-op Pain Management:    Induction: Intravenous  Airway Management Planned:   Additional Equipment:   Intra-op Plan:   Post-operative Plan: Extubation in OR  Informed Consent: I have reviewed the patients History and Physical, chart, labs and discussed the procedure including the risks, benefits and alternatives for the proposed anesthesia with the patient or authorized representative who has indicated his/her understanding and acceptance.   Dental advisory given  Plan Discussed with: CRNA  Anesthesia Plan Comments:         Anesthesia Quick Evaluation

## 2011-04-02 NOTE — Progress Notes (Signed)
Upper dentures returned to patient

## 2011-04-02 NOTE — Anesthesia Procedure Notes (Signed)
Procedure Name: LMA Insertion Date/Time: 04/02/2011 9:04 AM Performed by: Huel Coventry Pre-anesthesia Checklist: Patient identified, Emergency Drugs available, Suction available and Patient being monitored Patient Re-evaluated:Patient Re-evaluated prior to inductionOxygen Delivery Method: Circle System Utilized Preoxygenation: Pre-oxygenation with 100% oxygen Intubation Type: IV induction Ventilation: Mask ventilation without difficulty LMA: LMA inserted LMA Size: 4.0 Number of attempts: 1 Airway Equipment and Method: bite block Placement Confirmation: positive ETCO2 Tube secured with: Tape Dental Injury: Teeth and Oropharynx as per pre-operative assessment

## 2011-04-02 NOTE — Transfer of Care (Signed)
Immediate Anesthesia Transfer of Care Note  Patient: Chase Marsh  Procedure(s) Performed:  TRANSURETHRAL RESECTION OF THE PROSTATE WITH GYRUS INSTRUMENTS - GYRUS    Patient Location: PACU  Anesthesia Type: General  Level of Consciousness: awake, alert  and oriented  Airway & Oxygen Therapy: Patient Spontanous Breathing and Patient connected to face mask oxygen  Post-op Assessment: Report given to PACU RN and Post -op Vital signs reviewed and stable  Post vital signs: Reviewed and stable  Complications: No apparent anesthesia complications

## 2011-04-02 NOTE — Op Note (Signed)
Pre-operative diagnosis : BPH  Postoperative diagnosis: Same  Operation: Cystourethroscopy, transurethral resection of median lobe, and left lateral lobe prostate  Surgeon:  S. Patsi Sears, MD  Anesthesia:  general  Preparation: After appropriate preanesthesia, the patient was brought to the operating room and placed on the operating table in the dorsal supine position. He had been given IV Tylenol 1 g. Timeout was observed, and the patient underwent preparation with Betadine solution in usual fashion, after general L&A anesthesia was introduced.  Review history: The patient is a 78 year old male, with bladder outlet obstruction symptoms, with international prostate symptom score she does 18, and cystoscopy showing enlarged left lateral lobe the prostate and enlarged median lobe of prostate, coalescing to cause bladder outlet obstruction. He is now for TURP.  Statement of  Likelihood of Success: Excellent. TIME-OUT observed.:  Procedure: The urethra is dilated to a size 32 [place], with some submucosal stenosis noted. The 24 resectoscope was then easily passed into the bladder, and cystoscopy reveals a large left lateral lobe the prostate, coalescing with enlarged median lobe, as identified an office cystoscopy. Resection the median lobe was accomplished first, with resection from the 5:00 and 7:00 positions. Resection of the enlarged lateral lobe, which then falls into the bladder outlet, is accomplished, resecting from the 1:00 to the 6 clock positions. Extensive cauterization is obtained, and minimal bleeding is noted. Chips were evacuated with the [facility] evacuator. A size 22 three-way hematuria catheter is placed to traction, but no CBI. The patient was given 15 mg of Toradol, awakened and taken to the recovery room in good condition. He will be admitted for 23 hour observation.

## 2011-04-02 NOTE — Anesthesia Postprocedure Evaluation (Signed)
  Anesthesia Post-op Note  Patient: Chase Marsh  Procedure(s) Performed:  TRANSURETHRAL RESECTION OF THE PROSTATE WITH GYRUS INSTRUMENTS - GYRUS    Patient Location: PACU  Anesthesia Type: General  Level of Consciousness: awake and alert   Airway and Oxygen Therapy: Patient Spontanous Breathing  Post-op Pain: mild  Post-op Assessment: Post-op Vital signs reviewed, Patient's Cardiovascular Status Stable, Respiratory Function Stable, Patent Airway and No signs of Nausea or vomiting  Post-op Vital Signs: stable  Complications: No apparent anesthesia complications

## 2011-04-03 ENCOUNTER — Encounter (HOSPITAL_BASED_OUTPATIENT_CLINIC_OR_DEPARTMENT_OTHER): Payer: Self-pay | Admitting: Urology

## 2011-04-03 MED ORDER — CIPROFLOXACIN HCL 500 MG PO TABS: 500.0000 mg | ORAL_TABLET | Freq: Two times a day (BID) | ORAL | Status: AC

## 2011-04-03 NOTE — Discharge Summary (Signed)
Physician Discharge Summary  Patient ID: Chase Marsh MRN: [id number] DOB/AGE: 04/19/1931 79 y.o.  Admit date: 04/02/2011 Discharge date: 04/03/2011  Admission Diagnoses:BPH  Discharge Diagnoses:  BPH  Discharged Condition: stable}  Hospital Course: TURP 04/02/11  Consults: none   Discharge Exam: Blood pressure 120/73, pulse 66, temperature 96.9 F (36.1 C), temperature source Oral, resp. rate 18, height 5\' 11"  (1.803 m), weight 77.111 kg (170 lb), SpO2 95.00%.   Disposition:   Discharge Orders    Future Orders Please Complete By Expires   Diet - low sodium heart healthy      Increase activity slowly        Medication List  As of 04/03/2011 10:54 AM   STOP taking these medications         Tamsulosin HCl 0.4 MG Caps         TAKE these medications         ciprofloxacin 500 MG tablet   Commonly known as: CIPRO   Take 1 tablet (500 mg total) by mouth 2 (two) times daily.      LIPITOR PO   Take 5 mg by mouth every evening.      multivitamin tablet   Take 1 tablet by mouth daily.      omeprazole 20 MG capsule   Commonly known as: PRILOSEC   Take 20 mg by mouth as needed.             SignedJethro Bolus I 04/03/2011, 10:54 AM

## 2011-06-16 ENCOUNTER — Encounter: Payer: Self-pay | Admitting: *Deleted

## 2011-11-02 ENCOUNTER — Other Ambulatory Visit: Payer: Self-pay | Admitting: Family Medicine

## 2011-11-02 ENCOUNTER — Ambulatory Visit
Admission: RE | Admit: 2011-11-02 | Discharge: 2011-11-02 | Disposition: A | Discharge status: Home / Self Care | Payer: Medicare Other | Source: Ambulatory Visit | Attending: Family Medicine | Admitting: Family Medicine

## 2011-11-02 DIAGNOSIS — M533 Sacrococcygeal disorders, not elsewhere classified: Secondary | ICD-10-CM

## 2012-06-19 ENCOUNTER — Emergency Department (HOSPITAL_COMMUNITY)
Admission: EM | Admit: 2012-06-19 | Discharge: 2012-06-20 | Disposition: A | Discharge status: Home / Self Care | Payer: Medicare Other | Attending: Emergency Medicine | Admitting: Emergency Medicine

## 2012-06-19 ENCOUNTER — Encounter (HOSPITAL_COMMUNITY): Payer: Self-pay

## 2012-06-19 DIAGNOSIS — R197 Diarrhea, unspecified: Secondary | ICD-10-CM | POA: Insufficient documentation

## 2012-06-19 DIAGNOSIS — Z85118 Personal history of other malignant neoplasm of bronchus and lung: Secondary | ICD-10-CM | POA: Insufficient documentation

## 2012-06-19 DIAGNOSIS — Z79899 Other long term (current) drug therapy: Secondary | ICD-10-CM | POA: Insufficient documentation

## 2012-06-19 DIAGNOSIS — E876 Hypokalemia: Secondary | ICD-10-CM | POA: Insufficient documentation

## 2012-06-19 DIAGNOSIS — Z87891 Personal history of nicotine dependence: Secondary | ICD-10-CM | POA: Insufficient documentation

## 2012-06-19 DIAGNOSIS — Z8529 Personal history of malignant neoplasm of other respiratory and intrathoracic organs: Secondary | ICD-10-CM | POA: Insufficient documentation

## 2012-06-19 DIAGNOSIS — K219 Gastro-esophageal reflux disease without esophagitis: Secondary | ICD-10-CM | POA: Insufficient documentation

## 2012-06-19 DIAGNOSIS — R112 Nausea with vomiting, unspecified: Secondary | ICD-10-CM | POA: Insufficient documentation

## 2012-06-19 DIAGNOSIS — Z87442 Personal history of urinary calculi: Secondary | ICD-10-CM | POA: Insufficient documentation

## 2012-06-19 DIAGNOSIS — Z8709 Personal history of other diseases of the respiratory system: Secondary | ICD-10-CM | POA: Insufficient documentation

## 2012-06-19 DIAGNOSIS — Z87448 Personal history of other diseases of urinary system: Secondary | ICD-10-CM | POA: Insufficient documentation

## 2012-06-19 DIAGNOSIS — G8929 Other chronic pain: Secondary | ICD-10-CM | POA: Insufficient documentation

## 2012-06-19 LAB — URINALYSIS, MICROSCOPIC ONLY
Bilirubin Urine: NEGATIVE
Glucose, UA: NEGATIVE mg/dL
Hgb urine dipstick: NEGATIVE
Ketones, ur: 15 mg/dL — AB
Leukocytes, UA: NEGATIVE
Protein, ur: NEGATIVE mg/dL
pH: 7.5 (ref 5.0–8.0)

## 2012-06-19 LAB — COMPREHENSIVE METABOLIC PANEL
ALT: 26 U/L (ref 0–53)
AST: 40 U/L — ABNORMAL HIGH (ref 0–37)
Albumin: 3.7 g/dL (ref 3.5–5.2)
Alkaline Phosphatase: 75 U/L (ref 39–117)
CO2: 29 mEq/L (ref 19–32)
Chloride: 92 mEq/L — ABNORMAL LOW (ref 96–112)
GFR calc non Af Amer: 84 mL/min — ABNORMAL LOW (ref >90)
Potassium: 3.2 mEq/L — ABNORMAL LOW (ref 3.5–5.1)
Total Bilirubin: 0.4 mg/dL (ref 0.3–1.2)

## 2012-06-19 LAB — CBC WITH DIFFERENTIAL/PLATELET
Eosinophils Relative: 0 % (ref 0–5)
HCT: 42.9 % (ref 39.0–52.0)
Hemoglobin: 15.2 g/dL (ref 13.0–17.0)
Lymphocytes Relative: 4 % — ABNORMAL LOW (ref 12–46)
Lymphs Abs: 0.5 10*3/uL — ABNORMAL LOW (ref 0.7–4.0)
MCV: 84.8 fL (ref 78.0–100.0)
Monocytes Absolute: 0.9 10*3/uL (ref 0.1–1.0)
Monocytes Relative: 8 % (ref 3–12)
RBC: 5.06 MIL/uL (ref 4.22–5.81)
RDW: 13.6 % (ref 11.5–15.5)
WBC: 10.6 10*3/uL — ABNORMAL HIGH (ref 4.0–10.5)

## 2012-06-19 MED ORDER — KETOROLAC TROMETHAMINE 30 MG/ML IJ SOLN
15.0000 mg | Freq: Once | INTRAMUSCULAR | Status: AC
  Administered 2012-06-19: 15 mg via INTRAVENOUS
  Filled 2012-06-19: qty 1

## 2012-06-19 MED ORDER — ACETAMINOPHEN 325 MG PO TABS
650.0000 mg | ORAL_TABLET | Freq: Once | ORAL | Status: DC
  Filled 2012-06-19: qty 2

## 2012-06-19 MED ORDER — POTASSIUM CHLORIDE CRYS ER 20 MEQ PO TBCR
40.0000 meq | EXTENDED_RELEASE_TABLET | Freq: Once | ORAL | Status: AC
  Administered 2012-06-19: 40 meq via ORAL
  Filled 2012-06-19: qty 2

## 2012-06-19 MED ORDER — ONDANSETRON HCL 4 MG/2ML IJ SOLN
4.0000 mg | Freq: Once | INTRAMUSCULAR | Status: AC
  Administered 2012-06-19: 4 mg via INTRAVENOUS
  Filled 2012-06-19: qty 2

## 2012-06-19 MED ORDER — GI COCKTAIL ~~LOC~~
30.0000 mL | Freq: Once | ORAL | Status: AC
  Administered 2012-06-19: 30 mL via ORAL
  Filled 2012-06-19: qty 30

## 2012-06-19 MED ORDER — ONDANSETRON 4 MG PO TBDP
8.0000 mg | ORAL_TABLET | Freq: Once | ORAL | Status: AC
  Administered 2012-06-19: 8 mg via ORAL
  Filled 2012-06-19: qty 2

## 2012-06-19 MED ORDER — SODIUM CHLORIDE 0.9 % IV SOLN
Freq: Once | INTRAVENOUS | Status: AC
  Administered 2012-06-19: 1000 mL via INTRAVENOUS

## 2012-06-19 NOTE — ED Provider Notes (Signed)
History     CSN: [id number]  Arrival date & time 06/19/12  1904   First MD Initiated Contact with Patient 06/19/12 2005      Chief Complaint  Patient presents with  . Emesis  . Diarrhea  . Nausea    (Consider location/radiation/quality/duration/timing/severity/associated sxs/prior treatment) HPI Comments: Chase Marsh is a pleasant, 79 year old gentleman, who developed nausea yesterday.  Several episodes that were then followed by liquid stools.  This has persisted throughout the night, and most of today.  He did call his primary care physician and got a prescription for Zofran, which he, states is not helping.  He no longer is vomiting, but still has profound nausea, and liquidy stools.  Denies any abdominal pain, but does, state that he has a burning sensation in his esophagus Patient has a history of mesothelioma  That has been [facility] at [facility]  He is not currently receiving chemotherapy or radiation therapy.  He is awaiting enrollment in a drug study  Patient is a 79 y.o. male presenting with vomiting and diarrhea. The history is provided by the patient.  Emesis Severity:  Moderate Duration:  1 day Timing:  Intermittent Quality:  Bilious material Progression:  Unchanged Chronicity:  New Relieved by:  Nothing Ineffective treatments:  Antiemetics Associated symptoms: diarrhea   Associated symptoms: no abdominal pain and no chills   Diarrhea Associated symptoms: no abdominal pain, no chills, no fever and no vomiting     Past Medical History  Diagnosis Date  . Bladder outlet obstruction   . BPH (benign prostatic hypertrophy) with urinary obstruction   . Hyperlipidemia   . History of lung cancer RIGHT LOWER LOBE- MESOTHELIOMA---  2007  . Frequent urination   . Urgency of urination   . Nocturia   . Shortness of breath on exertion   . Patient able to exercise   . Malignant mesothelioma of pleura ONCOLOGIST-  DR Okey Dupre AT [facility]-----  DX  JULY 2007--  S/P CHEMORADIATION ,  LUNG SURG. RIGHT LOWER LUBE    RECURRENCE 2010 W/ CHEMO THERAPY  . Kidney stone   . Pneumothorax     1968  . Chronic headaches   . GERD (gastroesophageal reflux disease)     Past Surgical History  Procedure Laterality Date  . Transurethral resection of prostate  SEPT  2008  . Knee arthroscopy  1994    RIGHT  . Right vat/ pleural bx/ diaphragmatic bx/ drainage pleural effusion  09-21-2005  . Thoracentesis  09-18-2005    RIGHT LOWER LOBE EFFUSION  . Right lung lining removed  NOV  2007  . Transurethral resection of prostate  04/02/2011    Procedure: TRANSURETHRAL RESECTION OF THE PROSTATE WITH GYRUS INSTRUMENTS;  Surgeon: Kathi Ludwig, MD;  Location: [facility];  Service: Urology;  Laterality: Left;  GYRUS      Family History  Problem Relation Age of Onset  . Cancer Mother   . Heart disease Sister     pacemaker    History  Substance Use Topics  . Smoking status: Former Smoker -- 50 years    Types: Cigarettes    Quit date: 07/28/2001  . Smokeless tobacco: Former Neurosurgeon  . Alcohol Use: No      Review of Systems  Constitutional: Negative for fever and chills.  Gastrointestinal: Positive for nausea and diarrhea. Negative for vomiting and abdominal pain.  Genitourinary: Negative for decreased urine volume.  Skin: Negative for pallor.  Neurological: Negative for dizziness.  All other systems reviewed  and are negative.    Allergies  Penicillins  Home Medications   Current Outpatient Rx  Name  Route  Sig  Dispense  Refill  . Aspirin-Salicylamide-Caffeine (BC HEADACHE POWDER PO)   Oral   Take 1 packet by mouth daily as needed (for pain).         . Multiple Vitamin (MULTIVITAMIN WITH MINERALS) TABS   Oral   Take 1 tablet by mouth daily.         Marland Kitchen omeprazole (PRILOSEC) 20 MG capsule   Oral   Take 20 mg by mouth daily as needed (for acid reflux).            BP 109/73  Pulse 85  Temp(Src) 98.9 F (37.2 C) (Oral)  Resp 22  SpO2  92%  Physical Exam  Constitutional: He is oriented to person, place, and time. He appears well-developed and well-nourished. No distress.  HENT:  Head: Normocephalic and atraumatic.  Mouth/Throat: Oropharynx is clear and moist.  Eyes: Pupils are equal, round, and reactive to light.  Pulmonary/Chest: Effort normal and breath sounds normal.  Abdominal: Soft. Bowel sounds are normal. He exhibits no distension. There is no tenderness.  Musculoskeletal: Normal range of motion.  Neurological: He is alert and oriented to person, place, and time.  Skin: Skin is warm.    ED Course  Procedures (including critical care time)  Labs Reviewed  CBC WITH DIFFERENTIAL - Abnormal; Notable for the following:    WBC 10.6 (*)    Neutrophils Relative 87 (*)    Neutro Abs 9.2 (*)    Lymphocytes Relative 4 (*)    Lymphs Abs 0.5 (*)    All other components within normal limits  COMPREHENSIVE METABOLIC PANEL - Abnormal; Notable for the following:    Sodium 133 (*)    Potassium 3.2 (*)    Chloride 92 (*)    Glucose, Bld 131 (*)    AST 40 (*)    GFR calc non Af Amer 84 (*)    All other components within normal limits  URINALYSIS, MICROSCOPIC ONLY - Abnormal; Notable for the following:    APPearance CLOUDY (*)    Ketones, ur 15 (*)    All other components within normal limits  LIPASE, BLOOD   No results found.   1. Nausea vomiting and diarrhea   2. Hypokalemia     ED ECG REPORT   Date: 06/19/2012  EKG Time: 11:18 PM  Rate: 75  Rhythm: normal sinus rhythm,  unchanged from previous tracings  Axis: right  Intervals: PAC  ST&T Change: normal  Narrative Interpretation: borderline but unchanged             MDM   Will check CBC i-STAT-patient provide nausea control, and monitor Patient's potassium was supplemented with 40 milliequivalents by mouth.  He was given a GI cocktail for the esophageal, discomfort, which has improved.  He is no longer nauseated.  He is tolerating by mouth  fluids.  EKG was requested by Dr. Lynelle Doctor reviewed and unchanged from previous EKGs       Chase Filter, NP 06/19/12 2318

## 2012-06-19 NOTE — ED Notes (Signed)
Patient presents with c/o nausea, vomiting and diarrhea. Acute onset of N/V 1 day ago (Sat) around 1100 am and diarrhea onset around 1800 1 day ago (Sat). Patient reports eating at [facility] 2 days ago (Fri night) and at [employer] (Sat AM). Phoned PCP yesterday afternoon and Rx for Zofran given. Patient reports "not much" relief in sx. Denies fevers or sweats. Reports headache and one episode of chills earlier today.

## 2012-06-20 NOTE — ED Provider Notes (Signed)
Medical screening examination/treatment/procedure(s) were conducted as a shared visit with non-physician practitioner(s) and myself.  I personally evaluated the patient during the encounter  Pt improved with fluids and antiemetics.  Has a component of gerd as well.  Doubt ACS, PE , pneumonia.  Pt tolerating PO at discharge.  Celene Kras, MD 06/20/12 [phone]

## 2012-09-19 ENCOUNTER — Encounter: Payer: Self-pay | Admitting: Cardiology

## 2013-01-26 ENCOUNTER — Other Ambulatory Visit: Payer: Self-pay

## 2013-07-16 ENCOUNTER — Emergency Department: Payer: Self-pay

## 2013-07-16 LAB — CBC WITH DIFFERENTIAL/PLATELET
Basophil #: 0 10*3/uL (ref 0.0–0.1)
Basophil %: 0.3 %
EOS ABS: 0.1 10*3/uL (ref 0.0–0.7)
Eosinophil %: 0.7 %
HCT: 35.1 % — AB (ref 40.0–52.0)
HGB: 11.5 g/dL — AB (ref 13.0–18.0)
LYMPHS PCT: 12.4 %
Lymphocyte #: 1.1 10*3/uL (ref 1.0–3.6)
MCH: 30.5 pg (ref 26.0–34.0)
MCHC: 32.7 g/dL (ref 32.0–36.0)
MCV: 93 fL (ref 80–100)
MONO ABS: 1.6 x10 3/mm — AB (ref 0.2–1.0)
MONOS PCT: 18.4 %
NEUTROS ABS: 5.9 10*3/uL (ref 1.4–6.5)
NEUTROS PCT: 68.2 %
PLATELETS: 89 10*3/uL — AB (ref 150–440)
RBC: 3.76 10*6/uL — AB (ref 4.40–5.90)
RDW: 23.8 % — ABNORMAL HIGH (ref 11.5–14.5)
WBC: 8.7 10*3/uL (ref 3.8–10.6)

## 2013-07-16 LAB — COMPREHENSIVE METABOLIC PANEL
ALBUMIN: 3.2 g/dL — AB (ref 3.4–5.0)
ANION GAP: 6 — AB (ref 7–16)
AST: 34 U/L (ref 15–37)
Alkaline Phosphatase: 127 U/L — ABNORMAL HIGH
BILIRUBIN TOTAL: 0.2 mg/dL (ref 0.2–1.0)
BUN: 8 mg/dL (ref 7–18)
CHLORIDE: 101 mmol/L (ref 98–107)
CO2: 30 mmol/L (ref 21–32)
CREATININE: 0.73 mg/dL (ref 0.60–1.30)
Calcium, Total: 8.7 mg/dL (ref 8.5–10.1)
EGFR (African American): >60
EGFR (Non-African Amer.): >60
Glucose: 95 mg/dL (ref 65–99)
Osmolality: 272 (ref 275–301)
POTASSIUM: 3.4 mmol/L — AB (ref 3.5–5.1)
SGPT (ALT): 25 U/L (ref 12–78)
Sodium: 137 mmol/L (ref 136–145)
TOTAL PROTEIN: 7.5 g/dL (ref 6.4–8.2)

## 2013-07-21 LAB — CULTURE, BLOOD (SINGLE)

## 2014-02-07 ENCOUNTER — Other Ambulatory Visit: Payer: Self-pay | Admitting: Family Medicine

## 2014-02-07 DIAGNOSIS — M545 Low back pain, unspecified: Secondary | ICD-10-CM

## 2014-02-07 DIAGNOSIS — R1909 Other intra-abdominal and pelvic swelling, mass and lump: Secondary | ICD-10-CM

## 2014-02-09 ENCOUNTER — Ambulatory Visit
Admission: RE | Admit: 2014-02-09 | Discharge: 2014-02-09 | Disposition: A | Discharge status: Home / Self Care | Payer: Medicare Other | Source: Ambulatory Visit | Attending: Family Medicine | Admitting: Family Medicine

## 2014-02-09 DIAGNOSIS — M545 Low back pain, unspecified: Secondary | ICD-10-CM

## 2014-02-09 DIAGNOSIS — R1909 Other intra-abdominal and pelvic swelling, mass and lump: Secondary | ICD-10-CM

## 2014-02-09 MED ORDER — IOHEXOL 300 MG/ML  SOLN
100.0000 mL | Freq: Once | INTRAMUSCULAR | Status: AC | PRN
  Administered 2014-02-09: 100 mL via INTRAVENOUS

## 2014-09-23 ENCOUNTER — Emergency Department (HOSPITAL_COMMUNITY)

## 2014-09-23 ENCOUNTER — Encounter (HOSPITAL_COMMUNITY): Payer: Self-pay | Admitting: *Deleted

## 2014-09-23 ENCOUNTER — Inpatient Hospital Stay (HOSPITAL_COMMUNITY)
Admission: EM | Admit: 2014-09-23 | Discharge: 2014-09-30 | DRG: 391 | Disposition: A | Discharge status: Home / Self Care | Attending: Internal Medicine | Admitting: Internal Medicine

## 2014-09-23 DIAGNOSIS — Z85118 Personal history of other malignant neoplasm of bronchus and lung: Secondary | ICD-10-CM | POA: Diagnosis not present

## 2014-09-23 DIAGNOSIS — Z6820 Body mass index (BMI) 20.0-20.9, adult: Secondary | ICD-10-CM

## 2014-09-23 DIAGNOSIS — Z880 Allergy status to penicillin: Secondary | ICD-10-CM | POA: Diagnosis not present

## 2014-09-23 DIAGNOSIS — C7989 Secondary malignant neoplasm of other specified sites: Secondary | ICD-10-CM | POA: Diagnosis present

## 2014-09-23 DIAGNOSIS — K920 Hematemesis: Secondary | ICD-10-CM

## 2014-09-23 DIAGNOSIS — K222 Esophageal obstruction: Principal | ICD-10-CM

## 2014-09-23 DIAGNOSIS — C450 Mesothelioma of pleura: Secondary | ICD-10-CM | POA: Diagnosis present

## 2014-09-23 DIAGNOSIS — N401 Enlarged prostate with lower urinary tract symptoms: Secondary | ICD-10-CM

## 2014-09-23 DIAGNOSIS — K2100 Gastro-esophageal reflux disease with esophagitis, without bleeding: Secondary | ICD-10-CM | POA: Insufficient documentation

## 2014-09-23 DIAGNOSIS — K5900 Constipation, unspecified: Secondary | ICD-10-CM | POA: Diagnosis present

## 2014-09-23 DIAGNOSIS — K567 Ileus, unspecified: Secondary | ICD-10-CM

## 2014-09-23 DIAGNOSIS — Z8589 Personal history of malignant neoplasm of other organs and systems: Secondary | ICD-10-CM

## 2014-09-23 DIAGNOSIS — Z515 Encounter for palliative care: Secondary | ICD-10-CM

## 2014-09-23 DIAGNOSIS — Z9221 Personal history of antineoplastic chemotherapy: Secondary | ICD-10-CM

## 2014-09-23 DIAGNOSIS — N138 Other obstructive and reflux uropathy: Secondary | ICD-10-CM | POA: Insufficient documentation

## 2014-09-23 DIAGNOSIS — E43 Unspecified severe protein-calorie malnutrition: Secondary | ICD-10-CM | POA: Diagnosis present

## 2014-09-23 DIAGNOSIS — Z923 Personal history of irradiation: Secondary | ICD-10-CM

## 2014-09-23 DIAGNOSIS — J189 Pneumonia, unspecified organism: Secondary | ICD-10-CM

## 2014-09-23 DIAGNOSIS — Z87891 Personal history of nicotine dependence: Secondary | ICD-10-CM

## 2014-09-23 DIAGNOSIS — Z66 Do not resuscitate: Secondary | ICD-10-CM | POA: Diagnosis present

## 2014-09-23 DIAGNOSIS — N400 Enlarged prostate without lower urinary tract symptoms: Secondary | ICD-10-CM | POA: Diagnosis present

## 2014-09-23 DIAGNOSIS — R12 Heartburn: Secondary | ICD-10-CM | POA: Diagnosis present

## 2014-09-23 DIAGNOSIS — I499 Cardiac arrhythmia, unspecified: Secondary | ICD-10-CM | POA: Diagnosis present

## 2014-09-23 DIAGNOSIS — R197 Diarrhea, unspecified: Secondary | ICD-10-CM | POA: Diagnosis not present

## 2014-09-23 DIAGNOSIS — K228 Other specified diseases of esophagus: Secondary | ICD-10-CM | POA: Diagnosis not present

## 2014-09-23 DIAGNOSIS — T450X5A Adverse effect of antiallergic and antiemetic drugs, initial encounter: Secondary | ICD-10-CM | POA: Diagnosis not present

## 2014-09-23 DIAGNOSIS — Z882 Allergy status to sulfonamides status: Secondary | ICD-10-CM | POA: Diagnosis not present

## 2014-09-23 DIAGNOSIS — R188 Other ascites: Secondary | ICD-10-CM

## 2014-09-23 DIAGNOSIS — R180 Malignant ascites: Secondary | ICD-10-CM | POA: Diagnosis present

## 2014-09-23 DIAGNOSIS — R1310 Dysphagia, unspecified: Secondary | ICD-10-CM

## 2014-09-23 DIAGNOSIS — K449 Diaphragmatic hernia without obstruction or gangrene: Secondary | ICD-10-CM | POA: Diagnosis present

## 2014-09-23 DIAGNOSIS — E876 Hypokalemia: Secondary | ICD-10-CM | POA: Diagnosis present

## 2014-09-23 DIAGNOSIS — K219 Gastro-esophageal reflux disease without esophagitis: Secondary | ICD-10-CM | POA: Diagnosis present

## 2014-09-23 DIAGNOSIS — Z85831 Personal history of malignant neoplasm of soft tissue: Secondary | ICD-10-CM

## 2014-09-23 DIAGNOSIS — R1114 Bilious vomiting: Secondary | ICD-10-CM

## 2014-09-23 DIAGNOSIS — K2289 Other specified disease of esophagus: Secondary | ICD-10-CM

## 2014-09-23 DIAGNOSIS — E785 Hyperlipidemia, unspecified: Secondary | ICD-10-CM | POA: Diagnosis present

## 2014-09-23 LAB — CBC WITH DIFFERENTIAL/PLATELET
BASOS PCT: 0 % (ref 0–1)
Basophils Absolute: 0 10*3/uL (ref 0.0–0.1)
EOS ABS: 0 10*3/uL (ref 0.0–0.7)
Eosinophils Relative: 1 % (ref 0–5)
HEMATOCRIT: 41.5 % (ref 39.0–52.0)
Hemoglobin: 13.7 g/dL (ref 13.0–17.0)
LYMPHS ABS: 0.6 10*3/uL — AB (ref 0.7–4.0)
Lymphocytes Relative: 12 % (ref 12–46)
MCH: 30.1 pg (ref 26.0–34.0)
MCHC: 33 g/dL (ref 30.0–36.0)
MCV: 91.2 fL (ref 78.0–100.0)
Monocytes Absolute: 0.9 10*3/uL (ref 0.1–1.0)
Monocytes Relative: 20 % — ABNORMAL HIGH (ref 3–12)
Neutro Abs: 3 10*3/uL (ref 1.7–7.7)
Neutrophils Relative %: 67 % (ref 43–77)
Platelets: 194 10*3/uL (ref 150–400)
RBC: 4.55 MIL/uL (ref 4.22–5.81)
RDW: 14.8 % (ref 11.5–15.5)
WBC: 4.5 10*3/uL (ref 4.0–10.5)

## 2014-09-23 LAB — COMPREHENSIVE METABOLIC PANEL
ALK PHOS: 108 U/L (ref 38–126)
ALT: 14 U/L — AB (ref 17–63)
ANION GAP: 10 (ref 5–15)
AST: 41 U/L (ref 15–41)
Albumin: 2.7 g/dL — ABNORMAL LOW (ref 3.5–5.0)
BILIRUBIN TOTAL: 0.6 mg/dL (ref 0.3–1.2)
BUN: 8 mg/dL (ref 6–20)
CO2: 29 mmol/L (ref 22–32)
CREATININE: 0.68 mg/dL (ref 0.61–1.24)
Calcium: 8.2 mg/dL — ABNORMAL LOW (ref 8.9–10.3)
Chloride: 99 mmol/L — ABNORMAL LOW (ref 101–111)
GFR calc Af Amer: >60 mL/min (ref >60)
GLUCOSE: 102 mg/dL — AB (ref 65–99)
POTASSIUM: 3.3 mmol/L — AB (ref 3.5–5.1)
SODIUM: 138 mmol/L (ref 135–145)
Total Protein: 6.5 g/dL (ref 6.5–8.1)

## 2014-09-23 LAB — LIPASE, BLOOD: Lipase: 45 U/L (ref 22–51)

## 2014-09-23 MED ORDER — LEVOTHYROXINE SODIUM 100 MCG IV SOLR
12.5000 ug | Freq: Every day | INTRAVENOUS | Status: DC
  Administered 2014-09-23 – 2014-09-30 (×8): 12.5 ug via INTRAVENOUS
  Filled 2014-09-23 (×9): qty 5

## 2014-09-23 MED ORDER — PROMETHAZINE HCL 25 MG/ML IJ SOLN
12.5000 mg | Freq: Once | INTRAMUSCULAR | Status: AC
  Administered 2014-09-23: 12.5 mg via INTRAVENOUS
  Filled 2014-09-23: qty 1

## 2014-09-23 MED ORDER — KCL IN DEXTROSE-NACL 40-5-0.45 MEQ/L-%-% IV SOLN
INTRAVENOUS | Status: DC
  Administered 2014-09-23 – 2014-09-25 (×4): via INTRAVENOUS
  Filled 2014-09-23 (×6): qty 1000

## 2014-09-23 MED ORDER — PANTOPRAZOLE SODIUM 40 MG IV SOLR
40.0000 mg | Freq: Two times a day (BID) | INTRAVENOUS | Status: DC
  Administered 2014-09-23 – 2014-09-28 (×10): 40 mg via INTRAVENOUS
  Filled 2014-09-23 (×11): qty 40

## 2014-09-23 MED ORDER — SODIUM CHLORIDE 0.9 % IV BOLUS (SEPSIS)
1000.0000 mL | Freq: Once | INTRAVENOUS | Status: AC
  Administered 2014-09-23: 1000 mL via INTRAVENOUS

## 2014-09-23 MED ORDER — MORPHINE SULFATE 2 MG/ML IJ SOLN
0.5000 mg | INTRAMUSCULAR | Status: DC | PRN
  Administered 2014-09-27 – 2014-09-28 (×3): 1 mg via INTRAVENOUS
  Filled 2014-09-23 (×3): qty 1

## 2014-09-23 MED ORDER — PANTOPRAZOLE SODIUM 40 MG IV SOLR
40.0000 mg | Freq: Once | INTRAVENOUS | Status: AC
  Administered 2014-09-23: 40 mg via INTRAVENOUS
  Filled 2014-09-23: qty 40

## 2014-09-23 MED ORDER — GI COCKTAIL ~~LOC~~
30.0000 mL | Freq: Once | ORAL | Status: AC
  Administered 2014-09-23: 30 mL via ORAL
  Filled 2014-09-23: qty 30

## 2014-09-23 MED ORDER — IOHEXOL 300 MG/ML  SOLN
100.0000 mL | Freq: Once | INTRAMUSCULAR | Status: AC | PRN
  Administered 2014-09-23: 80 mL via INTRAVENOUS

## 2014-09-23 MED ORDER — SODIUM CHLORIDE 0.9 % IV SOLN
INTRAVENOUS | Status: AC
  Administered 2014-09-23: 16:00:00 via INTRAVENOUS

## 2014-09-23 NOTE — Progress Notes (Signed)
  Radiation Oncology         [phone]) [phone] ________________________________  Name: Chase Marsh  MRN: [id number]  Date: 09/23/2014  DOB: 1931-04-14  Chart Note:  I reviewed this patient's most recent findings and wanted to take a minute to document my impression.  This is a complex patient diagnosed with mesothelioma in 2007, and treated with surgery, chemo and courses of localized radiotherapy all at [facility], until enrolling in Hospice after progression with ascites within the past 30 days.  He now has dysphagia and presents to [facility] with chest CT showing localized distal esophageal narrowing due to extrinsic pleural masses.  Options at this point could include comfort care, endoscopy and consideration of stent, and possibly localized radiotherapy.  GI consult is pending.  I will plan to see the patient on 09/24/14 to discuss radiation options.   ________________________________  Chase Marsh, M.D.

## 2014-09-23 NOTE — ED Provider Notes (Signed)
CSN: [id number]     Arrival date & time 09/23/14  0848 History   First MD Initiated Contact with Patient 09/23/14 [phone]     Chief Complaint  Patient presents with  . Nausea  . Hematemesis     (Consider location/radiation/quality/duration/timing/severity/associated sxs/prior Treatment) HPI Comments: Patient is in a 81-year-old male with history of mesothelioma which is metastatic. He presents for evaluation of esophageal burning and difficulty swallowing. He states that this has worsened over the past several days. Over the past 2 days he is unable to take down any fluids due to the burning. He denies any fevers or chills. He denies any difficulty breathing. He denies abdominal pain or diarrhea  The history is provided by the patient.    Past Medical History  Diagnosis Date  . Bladder outlet obstruction   . BPH (benign prostatic hypertrophy) with urinary obstruction   . Hyperlipidemia   . History of lung cancer RIGHT LOWER LOBE- MESOTHELIOMA---  2007  . Frequent urination   . Urgency of urination   . Nocturia   . Shortness of breath on exertion   . Patient able to exercise   . Malignant mesothelioma of pleura ONCOLOGIST-  DR Sharlet Salina AT [facility]-----  DX  JULY 2007--  S/P CHEMORADIATION , LUNG SURG. RIGHT LOWER LUBE    RECURRENCE 2010 W/ CHEMO THERAPY  . Kidney stone   . Pneumothorax     1968  . Chronic headaches   . GERD (gastroesophageal reflux disease)    Past Surgical History  Procedure Laterality Date  . Transurethral resection of prostate  SEPT  2008  . Knee arthroscopy  1994    RIGHT  . Right vat/ pleural bx/ diaphragmatic bx/ drainage pleural effusion  09-21-2005  . Thoracentesis  09-18-2005    RIGHT LOWER LOBE EFFUSION  . Right lung lining removed  NOV  2007  . Transurethral resection of prostate  04/02/2011    Procedure: TRANSURETHRAL RESECTION OF THE PROSTATE WITH GYRUS INSTRUMENTS;  Surgeon: Ailene Rud, MD;  Location: [facility];  Service:  Urology;  Laterality: Left;  GYRUS     Family History  Problem Relation Age of Onset  . Cancer Mother   . Heart disease Sister     pacemaker   History  Substance Use Topics  . Smoking status: Former Smoker -- 50 years    Types: Cigarettes    Quit date: 07/28/2001  . Smokeless tobacco: Former Systems developer  . Alcohol Use: No    Review of Systems  All other systems reviewed and are negative.     Allergies  Penicillins and Sulfa antibiotics  Home Medications   Prior to Admission medications   Medication Sig Start Date End Date Taking? Authorizing Provider  levothyroxine (SYNTHROID, LEVOTHROID) 25 MCG tablet Take 25 mcg by mouth daily before breakfast.   Yes Historical Provider, MD  magnesium oxide (MAG-OX) 400 MG tablet Take 400 mg by mouth daily.   Yes Historical Provider, MD  sucralfate (CARAFATE) 1 GM/10ML suspension Take 1 g by mouth 4 (four) times daily -  with meals and at bedtime.   Yes Historical Provider, MD   BP 106/66 mmHg  Pulse 83  Temp(Src) 98.3 F (36.8 C) (Oral)  Resp 19  SpO2 95% Physical Exam  Constitutional: He is oriented to person, place, and time. No distress.  Patient appears chronically ill. He is in no acute distress.  HENT:  Head: Normocephalic and atraumatic.  Mouth/Throat: Oropharynx is clear and moist.  Neck:  Normal range of motion. Neck supple.  Cardiovascular: Normal rate, regular rhythm and normal heart sounds.   No murmur heard. Pulmonary/Chest: Effort normal and breath sounds normal. No respiratory distress. He has no wheezes.  Abdominal: Soft. Bowel sounds are normal. He exhibits no distension. There is no tenderness.  Musculoskeletal: Normal range of motion. He exhibits no edema.  Lymphadenopathy:    He has no cervical adenopathy.  Neurological: He is alert and oriented to person, place, and time.  Skin: Skin is warm and dry. He is not diaphoretic.  Nursing note and vitals reviewed.   ED Course  Procedures (including critical care  time) Labs Review Labs Reviewed  CBC WITH DIFFERENTIAL/PLATELET - Abnormal; Notable for the following:    Lymphs Abs 0.6 (*)    Monocytes Relative 20 (*)    All other components within normal limits  COMPREHENSIVE METABOLIC PANEL - Abnormal; Notable for the following:    Potassium 3.3 (*)    Chloride 99 (*)    Glucose, Bld 102 (*)    Calcium 8.2 (*)    Albumin 2.7 (*)    ALT 14 (*)    All other components within normal limits  LIPASE, BLOOD    Imaging Review No results found.   EKG Interpretation None      MDM   Final diagnoses:  None    Patient presents with complaints of epigastric and esophageal burning. He has a history of mesothelioma and workup suggests that his pleural-based masses are causing an esophageal obstruction. This is been discussed with Dr. Watt Climes from gastroenterology who is recommending admission for possible esophageal stent placement. The patient is on hospice, however is amenable to this. I've spoken with Dr. Sheran Fava from the hospitalist service who agrees to admit.    Veryl Speak, MD 09/23/14 [phone]

## 2014-09-23 NOTE — ED Notes (Signed)
Bed: FF63 Expected date: 09/23/14 Expected time: 8:46 AM Means of arrival: Ambulance Comments: Vomiting Blood since yesterday

## 2014-09-23 NOTE — ED Notes (Signed)
I have attempted to call report--nurse is in an isolation room and will call back soon.

## 2014-09-23 NOTE — H&P (Signed)
[facility] History and Physical  KORD MONETTE HDQ:[id number] DOB: 1931/10/21 DOA: 09/23/2014  Referring physician:  Veryl Speak PCP:  Leonard Downing, MD   Chief Complaint:  Vomiting blood, heartburn  HPI:  The patient is a 81 y.o. year-old male with history of mesothelioma of the right lung and pleura diagnosed in 2007 s/p chemotherapy, right parietal pleurectomy and XRT who presents with emesis, hematemesis, anorexia, heartburn.  The patient was seen by his oncologist at [facility] on 08/21/2014 at which time he was struggling with dehydration, nausea, anorexia. He was having progression of his malignancy with new ascites on CT scan. He was referred to hospice and palliative care [facility]. He has had ongoing weight loss, anorexia, progressive weakness for the last month. Over the last several days he has had a sensation of food and liquid getting stuck in his upper chest. He has been regurgitating food and liquid despite eating a softened foods diet. Over the last 24 hours he has developed severe 10 out of 10 burning pain in the back of his mouth which has not improved with ranitidine, Carafate. Initially he told me he was taking Protonix also however he later stated that he only took a couple pills of this about a month ago and then stopped taking it. He is unclear why he stopped his PPI.  He states every time he takes his thyroid medication he gets burning in his chest and regurgitates his pill. He tried to relieve the sensation of heartburn and nausea by heaving which did relieve his discomfort temporarily however he developed hematemesis. Initially had dark brownish black emesis but then he developed bright red blood, several mouthfuls. He has not had further hematemesis. He states he his bowels have been normal in frequency, consistency, and color. He denies abdominal pain except for the chronic lower abdominal fullness for which he had an taking oxycodone prescribed by palliative care and  hospice.  He denies fevers, chills, sinus congestion, dysuria, difficulty urinating.  He denies wheezing and difficulty breathing however he has shortness of breath with exertion and has been using 2 L of home oxygen anytime he stands up to move around.    In the emergency department, vital signs were notable only for a mildly low blood pressure of 97/67, otherwise stable. Labs were normal except for potassium of 3.3, his hemoglobin was normal as was his BUN and creatinine. He was given a normal saline bolus, Protonix, Phenergan, and a GI cocktail and is being admitted for esophageal obstruction secondary to malignancy.    Review of Systems:  General:  Denies fevers, chills, positive weight loss and anorexia HEENT:  Denies changes to hearing and vision, rhinorrhea, sinus congestion, sore throat CV:  Denies chest pain and palpitations, lower extremity edema.  PULM:  Denies wheezing, cough.   GI:  GU:  Denies dysuria, frequency, urgency ENDO:  Denies polyuria, polydipsia.   HEME:  Denies blood in stools, melena, abnormal bruising.  LYMPH:  Denies lymphadenopathy.   MSK:  Denies arthralgias, myalgias.   DERM:  Denies skin rash or ulcer.   NEURO:  Denies focal numbness, weakness, slurred speech, confusion, facial droop.  PSYCH:  Denies anxiety and depression.    Past Medical History  Diagnosis Date  . Bladder outlet obstruction   . BPH (benign prostatic hypertrophy) with urinary obstruction   . Hyperlipidemia   . History of lung cancer RIGHT LOWER LOBE- MESOTHELIOMA---  2007  . Frequent urination   . Urgency of urination   .  Nocturia   . Shortness of breath on exertion   . Patient able to exercise   . Malignant mesothelioma of pleura ONCOLOGIST-  DR Sharlet Salina AT [facility]-----  DX  JULY 2007--  S/P CHEMORADIATION , LUNG SURG. RIGHT LOWER LUBE    RECURRENCE 2010 W/ CHEMO THERAPY  . Kidney stone   . Pneumothorax     1968  . Chronic headaches   . GERD (gastroesophageal reflux disease)    Past  Surgical History  Procedure Laterality Date  . Transurethral resection of prostate  SEPT  2008  . Knee arthroscopy  1994    RIGHT  . Right vat/ pleural bx/ diaphragmatic bx/ drainage pleural effusion  09-21-2005  . Thoracentesis  09-18-2005    RIGHT LOWER LOBE EFFUSION  . Right lung lining removed  NOV  2007  . Transurethral resection of prostate  04/02/2011    Procedure: TRANSURETHRAL RESECTION OF THE PROSTATE WITH GYRUS INSTRUMENTS;  Surgeon: Ailene Rud, MD;  Location: [facility];  Service: Urology;  Laterality: Left;  GYRUS     Social History:  reports that he quit smoking about 13 years ago. His smoking use included Cigarettes. He quit after 50 years of use. He has quit using smokeless tobacco. He reports that he does not drink alcohol or use illicit drugs. Lives at home with his wife  Allergies  Allergen Reactions  . Penicillins Rash  . Sulfa Antibiotics Rash    Family History  Problem Relation Age of Onset  . Cancer Mother   . Heart disease Sister     pacemaker     Prior to Admission medications   Medication Sig Start Date End Date Taking? Authorizing Provider  levothyroxine (SYNTHROID, LEVOTHROID) 25 MCG tablet Take 25 mcg by mouth daily before breakfast.   Yes Historical Provider, MD  magnesium oxide (MAG-OX) 400 MG tablet Take 400 mg by mouth daily.   Yes Historical Provider, MD  sucralfate (CARAFATE) 1 GM/10ML suspension Take 1 g by mouth 4 (four) times daily -  with meals and at bedtime.   Yes Historical Provider, MD   Physical Exam: Filed Vitals:   09/23/14 0254 09/23/14 0953 09/23/14 1244 09/23/14 1543  BP: 113/83 106/66 97/67 102/63  Pulse: 83  86 90  Temp:   98.4 F (36.9 C) 98.1 F (36.7 C)  TempSrc:   Oral Oral  Resp: 19  20 18   SpO2: 95%  94% 96%     General:  Thin male, in no acute distress  Eyes:  PERRL, anicteric, non-injected.  ENT:  Nares clear.  OP clear, with petechia and mild erythema of the soft palate.  Mildly  dry mucous membranes.  Neck:  Supple without TM or JVD.    Lymph:  No cervical, supraclavicular, or submandibular LAD.  Cardiovascular:  IRRR, normal S1, S2, without m/r/g.  2+ pulses, warm extremities  Respiratory:  Rales at the bilateral bases that are very coarse sounding, question if this is pleural rub, no rhonchi or wheeze without increased WOB.  Abdomen:  NABS.  Soft, mildly distended and multiple masses felt one in the left upper quadrant that was large and indiscrete somewhat near the border of the umbilicus, also some firmness and fullness in the right upper quadrant without tenderness  Skin:  No rashes or focal lesions.  Musculoskeletal:  Normal bulk and tone.  No LE edema.  Psychiatric:  A & O x 4.  Appropriate affect.  Neurologic:  CN 3-12 intact.  5/5 strength.  Sensation intact.  Labs on Admission:  Basic Metabolic Panel:  Recent Labs Lab 09/23/14 0905  NA 138  K 3.3*  CL 99*  CO2 29  GLUCOSE 102*  BUN 8  CREATININE 0.68  CALCIUM 8.2*   Liver Function Tests:  Recent Labs Lab 09/23/14 0905  AST 41  ALT 14*  ALKPHOS 108  BILITOT 0.6  PROT 6.5  ALBUMIN 2.7*    Recent Labs Lab 09/23/14 0905  LIPASE 45   No results for input(s): AMMONIA in the last 168 hours. CBC:  Recent Labs Lab 09/23/14 0905  WBC 4.5  NEUTROABS 3.0  HGB 13.7  HCT 41.5  MCV 91.2  PLT 194   Cardiac Enzymes: No results for input(s): CKTOTAL, CKMB, CKMBINDEX, TROPONINI in the last 168 hours.  BNP (last 3 results) No results for input(s): BNP in the last 8760 hours.  ProBNP (last 3 results) No results for input(s): PROBNP in the last 8760 hours.  CBG: No results for input(s): GLUCAP in the last 168 hours.  Radiological Exams on Admission: Ct Chest W Contrast  09/23/2014   CLINICAL DATA:  Epigastric burning/ pain without relief. Vomiting. History of end-stage mesothelioma. Esophageal pain.  EXAM: CT CHEST WITH CONTRAST  TECHNIQUE: Multidetector CT imaging of the  chest was performed during intravenous contrast administration.  CONTRAST:  48mL OMNIPAQUE IOHEXOL 300 MG/ML  SOLN  COMPARISON:  02/09/2014 abdominal pelvic CT. Chest radiograph of 07/16/2013. Most recent chest CT of 09/15/2005.  FINDINGS: Mediastinum/Nodes: Low right jugular 1.0 cm node on image 9 is suspicious for nodal metastasis. Tortuous thoracic aorta. Mild cardiomegaly with multivessel coronary artery atherosclerosis. No central pulmonary embolism, on this non-dedicated study.  No mediastinal or hilar adenopathy. The esophagus is compressed by pleural base masses, including on image 44 of series 2. No contrast extravasation. The more cephalad esophagus is contrast filled and dilated, including on image 15 of series 2. A small hiatal hernia.  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 Right-sided pleural-based masses, consistent with the clinical history of mesothelioma. An index low anterior right pleural mass measures 3.7 x 3.0 cm on image 33. Enlarged from 1.5 x 1.3 cm on 02/09/2014 (when remeasured). Left-sided pleural based mass or node measures 3.4 x 1.9 cm on image 51 versus 1.0 x 2.2 cm on 02/09/2014.  Mild degradation secondary to patient arms not being raised above the head.  No pulmonary parenchymal abnormality.  Upper abdomen: Normal imaged portions of the liver, spleen, stomach, right adrenal gland. New soft tissue mass within the right upper quadrant, including on image 45 of series 2. This surrounds the hepatic flexure colon. Concurrent new upper abdominal ascites.  Musculoskeletal: Subtle sclerosis involving lower lateral right ribs, including on image 53 of series 2.  IMPRESSION: 1. Progressive right worse than left pleural masses, consistent with the clinical history of mesothelioma. 2. Significant mass effect upon the distal esophagus by pleural-based masses. This causes partial obstruction, as evidenced by dilatation and contrast  superiorly. 3. Development of right upper quadrant peritoneal mass and abdominal ascites. This is consistent with either abdominal metastatic disease or tran diaphragmatic spread of right pleural primary. 4.  Atherosclerosis, including within the coronary arteries. 5. Low right jugular node is mildly enlarged and suspicious for metastatic disease. 6. Subtle sclerosis within lower right ribs. Favored to be related to remote trauma.   Electronically Signed   By: Abigail Miyamoto M.D.   On: 09/23/2014 12:43    EKG: Pending  Assessment/Plan  Principal Problem:   Esophageal obstruction Active Problems:   Esophageal pain   Malignant mesothelioma of pleura   GERD (gastroesophageal reflux disease)   Hematemesis  ---  Esophageal obstruction due to progressive malignancy.  The treatment options include palliative radiation, stent placement by gastroenterology, or possible PEG tube placement.  [facility] reviewed their records and think that he may be able to have additional palliative radiation, however, they have asked that our XRT oncologists review their records and consult.  Agree that PEG tube placement would be complicated by the patient's existing tumor burden and by his ascites.  Also, the patient does not want to undergo PEG tube placement.  Esophageal stent placement would be the fastest solution and given his decline, may be the best option for him.   -  GI consult for possible stent placement  -  XRT Oncology consulted -  Nothing by mouth  -  Medications by IV  -  IV fluids  -  IV PPI  -  Morphine for pain control  -  Oral care  -  I have notified hospice of patient's admission -  Social work consult  BPH, stable, monitor for urinary retention -  Check PVR and place Foley if greater than 300 mL's of retained urine  Irregular heart rhythm, rate appears controlled. Patient is not a good candidate for anticoagulation secondary to his hematemesis. No further workup at this time recommended.    Hypokalemia, IV repletion  Diet:  Nothing by mouth Access:  PIV IVF: Yes Proph:  SCDs  Code Status:  DO NOT RESUSCITATE Family Communication:  patient and his wife Disposition Plan: Admit to MedSurg  Time spent: 60 min Janece Canterbury [facility] Pager [phone]  If 7PM-7AM, please contact night-coverage [email or web address] Password [email or web address] 09/23/2014, 4:31 PM

## 2014-09-23 NOTE — ED Notes (Signed)
Per EMS pt coming from home, he is hospice pt with hx of mesothelioma, relapsed recently, finished radiation month ago and has been declining since. Yesterday pt develop n/v overnight noticed red blood in vomit, today it's dark blood in emesis per EMS. Pt denies pain. Pt has yellow DNR form.

## 2014-09-23 NOTE — ED Notes (Addendum)
He tells me he feels "better" and the (esophageal) "burning is almost gone".  His family remain with  Him.

## 2014-09-23 NOTE — ED Notes (Signed)
We administered a G.I. Cocktail per pt. Request (Dr. Stark Jock had oferred it earlier and he had declined); shortly after which he vomited.  Dr. Stark Jock states he will re-evaluate pt. Soon.

## 2014-09-24 ENCOUNTER — Encounter (HOSPITAL_COMMUNITY): Payer: Self-pay | Admitting: Gastroenterology

## 2014-09-24 ENCOUNTER — Ambulatory Visit: Payer: PRIVATE HEALTH INSURANCE | Attending: Radiation Oncology | Admitting: Radiation Oncology

## 2014-09-24 DIAGNOSIS — E43 Unspecified severe protein-calorie malnutrition: Secondary | ICD-10-CM

## 2014-09-24 DIAGNOSIS — R110 Nausea: Secondary | ICD-10-CM

## 2014-09-24 DIAGNOSIS — R188 Other ascites: Secondary | ICD-10-CM

## 2014-09-24 DIAGNOSIS — K920 Hematemesis: Secondary | ICD-10-CM

## 2014-09-24 LAB — CBC
HCT: 37 % — ABNORMAL LOW (ref 39.0–52.0)
Hemoglobin: 11.8 g/dL — ABNORMAL LOW (ref 13.0–17.0)
MCH: 29.4 pg (ref 26.0–34.0)
MCHC: 31.9 g/dL (ref 30.0–36.0)
MCV: 92 fL (ref 78.0–100.0)
PLATELETS: 180 10*3/uL (ref 150–400)
RBC: 4.02 MIL/uL — ABNORMAL LOW (ref 4.22–5.81)
RDW: 15.2 % (ref 11.5–15.5)
WBC: 4.3 10*3/uL (ref 4.0–10.5)

## 2014-09-24 LAB — RENAL FUNCTION PANEL
ALBUMIN: 2.5 g/dL — AB (ref 3.5–5.0)
Anion gap: 8 (ref 5–15)
BUN: 7 mg/dL (ref 6–20)
CHLORIDE: 104 mmol/L (ref 101–111)
CO2: 27 mmol/L (ref 22–32)
Calcium: 7.5 mg/dL — ABNORMAL LOW (ref 8.9–10.3)
Creatinine, Ser: 0.73 mg/dL (ref 0.61–1.24)
GFR calc Af Amer: >60 mL/min (ref >60)
GLUCOSE: 85 mg/dL (ref 65–99)
POTASSIUM: 3.5 mmol/L (ref 3.5–5.1)
Phosphorus: 2.5 mg/dL (ref 2.5–4.6)
Sodium: 139 mmol/L (ref 135–145)

## 2014-09-24 MED ORDER — GLYCERIN (LAXATIVE) 2.1 G RE SUPP
1.0000 | Freq: Every day | RECTAL | Status: DC | PRN
  Filled 2014-09-24: qty 1

## 2014-09-24 MED ORDER — BISACODYL 10 MG RE SUPP: 10.0000 mg | Freq: Every day | RECTAL | Status: DC | PRN

## 2014-09-24 NOTE — Progress Notes (Signed)
[facility] patient - they do their own reviews.

## 2014-09-24 NOTE — Progress Notes (Signed)
Initial Nutrition Assessment  DOCUMENTATION CODES:  Severe malnutrition in context of chronic illness, Underweight  INTERVENTION:  Other (Comment) (Will order Boost Plus TID with diet advancement)  NUTRITION DIAGNOSIS:  Increased nutrient needs related to cancer and cancer related treatments as evidenced by estimated needs  GOAL:  Patient will meet greater than or equal to 90% of their needs  MONITOR:  Diet advancement, Weight trends, Labs, I & O's  REASON FOR ASSESSMENT:  Malnutrition Screening Tool  ASSESSMENT: 81 y.o. year-old male with history of mesothelioma of the right lung and pleura diagnosed in 2007 s/p chemotherapy, right parietal pleurectomy and XRT who presents with emesis, hematemesis, anorexia, heartburn. The patient was seen by his oncologist at [facility] on 08/21/2014 at which time he was struggling with dehydration, nausea, anorexia. He was having progression of his malignancy with new ascites on CT scan. He was referred to hospice and palliative care [facility]. He has had ongoing weight loss, anorexia, progressive weakness for the last month. Over the last several days he has had a sensation of food and liquid getting stuck in his upper chest. He has been regurgitating food and liquid despite eating a softened foods diet.   Pt seen for MST. BMI indicates normal weight status using weight from 2013; not accurate. Moderate to severe muscle and fat wasting noted. Pt has been NPO since admission; unable to meet needs.  PTA, the last thing he consumed was a few bites of chicken noodle soup and 2 crackers along with some fluids 7/2. Over the past few months appetite has been declining but he was consuming slightly more/eating more frequently since return home from [facility] 09/23/14.  Pt reports that burning sensation is new-onset and was worse with PO intakes but that it has resolved since yesterday. He indicates that food/drinks felt like they were getting stuck in his esophagus  but no coughing would occur with intakes. PTA he was drinking Boost which wife would mix with ice cream. He states he likes food "as cold as I can get it."  Pt reports that he lost 17 lbs in the past 1 month from 157-140 lbs (11% body weight). No recent weight recorded in weight hx. Severe muscle and fat wasting noted. Medications reviewed. Labs reviewed; Ca: 7.5 mg/dL.  Height:  Ht Readings from Last 1 Encounters:  03/31/11 5\' 11"  (1.803 m)    Weight:  Wt Readings from Last 1 Encounters:  03/31/11 170 lb (77.111 kg)    Ideal Body Weight:  78.2 kg  Wt Readings from Last 10 Encounters:  03/31/11 170 lb (77.111 kg)    BMI:  23.7 kg/m2  Estimated Nutritional Needs:  Kcal:  2200-2400  Protein:  80-95 grams  Fluid:  2.5 L/day  Skin:  Reviewed, no issues  Diet Order:  Diet NPO time specified  EDUCATION NEEDS:  No education needs identified at this time   Intake/Output Summary (Last 24 hours) at 09/24/14 1022 Last data filed at 09/24/14 0930  Gross per 24 hour  Intake      0 ml  Output    690 ml  Net   -690 ml    Last BM:  PTA    Jarome Matin, RD, LDN Inpatient Clinical Dietitian Pager # [phone] After hours/weekend pager # [phone]

## 2014-09-24 NOTE — Consult Note (Signed)
Reason for Consult: Inability to swallow Referring Physician: Hospital team  Chase Marsh is an 81 y.o. male.  HPI: Patient seen and and examined and case discussed with the ER physician yesterday the hospital team yesterday and radiation oncology today as well as the patient and multiple family members including his wife and is history was reviewed and is been having mild swallowing problems for a few weeks but it's been significantly worse since Thursday and he did not like radiation therapy at [facility] for a groin lesion and we discussed his mild ascites as well and he has no specific complaints otherwise  Past Medical History  Diagnosis Date  . Bladder outlet obstruction   . BPH (benign prostatic hypertrophy) with urinary obstruction   . Hyperlipidemia   . History of lung cancer RIGHT LOWER LOBE- MESOTHELIOMA---  2007  . Frequent urination   . Urgency of urination   . Nocturia   . Shortness of breath on exertion   . Patient able to exercise   . Malignant mesothelioma of pleura ONCOLOGIST-  DR Okey Dupre AT [facility]-----  DX  JULY 2007--  S/P CHEMORADIATION , LUNG SURG. RIGHT LOWER LUBE    RECURRENCE 2010 W/ CHEMO THERAPY  . Kidney stone   . Pneumothorax     1968  . Chronic headaches   . GERD (gastroesophageal reflux disease)     Past Surgical History  Procedure Laterality Date  . Transurethral resection of prostate  SEPT  2008  . Knee arthroscopy  1994    RIGHT  . Right vat/ pleural bx/ diaphragmatic bx/ drainage pleural effusion  09-21-2005  . Thoracentesis  09-18-2005    RIGHT LOWER LOBE EFFUSION  . Right lung lining removed  NOV  2007  . Transurethral resection of prostate  04/02/2011    Procedure: TRANSURETHRAL RESECTION OF THE PROSTATE WITH GYRUS INSTRUMENTS;  Surgeon: Kathi Ludwig, MD;  Location: [facility];  Service: Urology;  Laterality: Left;  GYRUS      Family History  Problem Relation Age of Onset  . Cancer Mother   . Heart disease Sister    pacemaker    Social History:  reports that he quit smoking about 13 years ago. His smoking use included Cigarettes. He quit after 50 years of use. He has quit using smokeless tobacco. He reports that he does not drink alcohol or use illicit drugs.  Allergies:  Allergies  Allergen Reactions  . Penicillins Rash  . Sulfa Antibiotics Rash    Medications: I have reviewed the patient's current medications.  Results for orders placed or performed during the hospital encounter of 09/23/14 (from the past 48 hour(s))  CBC with Differential/Platelet     Status: Abnormal   Collection Time: 09/23/14  9:05 AM  Result Value Ref Range   WBC 4.5 4.0 - 10.5 K/uL   RBC 4.55 4.22 - 5.81 MIL/uL   Hemoglobin 13.7 13.0 - 17.0 g/dL   HCT 22.9 41.3 - 67.3 %   MCV 91.2 78.0 - 100.0 fL   MCH 30.1 26.0 - 34.0 pg   MCHC 33.0 30.0 - 36.0 g/dL   RDW 72.8 43.1 - 08.5 %   Platelets 194 150 - 400 K/uL   Neutrophils Relative % 67 43 - 77 %   Neutro Abs 3.0 1.7 - 7.7 K/uL   Lymphocytes Relative 12 12 - 46 %   Lymphs Abs 0.6 (L) 0.7 - 4.0 K/uL   Monocytes Relative 20 (H) 3 - 12 %  Monocytes Absolute 0.9 0.1 - 1.0 K/uL   Eosinophils Relative 1 0 - 5 %   Eosinophils Absolute 0.0 0.0 - 0.7 K/uL   Basophils Relative 0 0 - 1 %   Basophils Absolute 0.0 0.0 - 0.1 K/uL  Comprehensive metabolic panel     Status: Abnormal   Collection Time: 09/23/14  9:05 AM  Result Value Ref Range   Sodium 138 135 - 145 mmol/L   Potassium 3.3 (L) 3.5 - 5.1 mmol/L   Chloride 99 (L) 101 - 111 mmol/L   CO2 29 22 - 32 mmol/L   Glucose, Bld 102 (H) 65 - 99 mg/dL   BUN 8 6 - 20 mg/dL   Creatinine, Ser 0.68 0.61 - 1.24 mg/dL   Calcium 8.2 (L) 8.9 - 10.3 mg/dL   Total Protein 6.5 6.5 - 8.1 g/dL   Albumin 2.7 (L) 3.5 - 5.0 g/dL   AST 41 15 - 41 U/L   ALT 14 (L) 17 - 63 U/L   Alkaline Phosphatase 108 38 - 126 U/L   Total Bilirubin 0.6 0.3 - 1.2 mg/dL   GFR calc non Af Amer >60 >60 mL/min   GFR calc Af Amer >60 >60 mL/min     Comment: (NOTE) The eGFR has been calculated using the CKD EPI equation. This calculation has not been validated in all clinical situations. eGFR's persistently <60 mL/min signify possible Chronic Kidney Disease.    Anion gap 10 5 - 15  Lipase, blood     Status: None   Collection Time: 09/23/14  9:05 AM  Result Value Ref Range   Lipase 45 22 - 51 U/L  CBC     Status: Abnormal   Collection Time: 09/24/14  4:47 AM  Result Value Ref Range   WBC 4.3 4.0 - 10.5 K/uL   RBC 4.02 (L) 4.22 - 5.81 MIL/uL   Hemoglobin 11.8 (L) 13.0 - 17.0 g/dL   HCT 37.0 (L) 39.0 - 52.0 %   MCV 92.0 78.0 - 100.0 fL   MCH 29.4 26.0 - 34.0 pg   MCHC 31.9 30.0 - 36.0 g/dL   RDW 15.2 11.5 - 15.5 %   Platelets 180 150 - 400 K/uL  Renal function panel     Status: Abnormal   Collection Time: 09/24/14  4:47 AM  Result Value Ref Range   Sodium 139 135 - 145 mmol/L   Potassium 3.5 3.5 - 5.1 mmol/L   Chloride 104 101 - 111 mmol/L   CO2 27 22 - 32 mmol/L   Glucose, Bld 85 65 - 99 mg/dL   BUN 7 6 - 20 mg/dL   Creatinine, Ser 0.73 0.61 - 1.24 mg/dL   Calcium 7.5 (L) 8.9 - 10.3 mg/dL   Phosphorus 2.5 2.5 - 4.6 mg/dL   Albumin 2.5 (L) 3.5 - 5.0 g/dL   GFR calc non Af Amer >60 >60 mL/min   GFR calc Af Amer >60 >60 mL/min    Comment: (NOTE) The eGFR has been calculated using the CKD EPI equation. This calculation has not been validated in all clinical situations. eGFR's persistently <60 mL/min signify possible Chronic Kidney Disease.    Anion gap 8 5 - 15    Ct Chest W Contrast  09/23/2014   CLINICAL DATA:  Epigastric burning/ pain without relief. Vomiting. History of end-stage mesothelioma. Esophageal pain.  EXAM: CT CHEST WITH CONTRAST  TECHNIQUE: Multidetector CT imaging of the chest was performed during intravenous contrast administration.  CONTRAST:  84mL OMNIPAQUE IOHEXOL 300  MG/ML  SOLN  COMPARISON:  02/09/2014 abdominal pelvic CT. Chest radiograph of 07/16/2013. Most recent chest CT of 09/15/2005.   FINDINGS: Mediastinum/Nodes: Low right jugular 1.0 cm node on image 9 is suspicious for nodal metastasis. Tortuous thoracic aorta. Mild cardiomegaly with multivessel coronary artery atherosclerosis. No central pulmonary embolism, on this non-dedicated study.  No mediastinal or hilar adenopathy. The esophagus is compressed by pleural base masses, including on image 44 of series 2. No contrast extravasation. The more cephalad esophagus is contrast filled and dilated, including on image 15 of series 2. A small hiatal hernia.  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 Right-sided pleural-based masses, consistent with the clinical history of mesothelioma. An index low anterior right pleural mass measures 3.7 x 3.0 cm on image 33. Enlarged from 1.5 x 1.3 cm on 02/09/2014 (when remeasured). Left-sided pleural based mass or node measures 3.4 x 1.9 cm on image 51 versus 1.0 x 2.2 cm on 02/09/2014.  Mild degradation secondary to patient arms not being raised above the head.  No pulmonary parenchymal abnormality.  Upper abdomen: Normal imaged portions of the liver, spleen, stomach, right adrenal gland. New soft tissue mass within the right upper quadrant, including on image 45 of series 2. This surrounds the hepatic flexure colon. Concurrent new upper abdominal ascites.  Musculoskeletal: Subtle sclerosis involving lower lateral right ribs, including on image 53 of series 2.  IMPRESSION: 1. Progressive right worse than left pleural masses, consistent with the clinical history of mesothelioma. 2. Significant mass effect upon the distal esophagus by pleural-based masses. This causes partial obstruction, as evidenced by dilatation and contrast superiorly. 3. Development of right upper quadrant peritoneal mass and abdominal ascites. This is consistent with either abdominal metastatic disease or tran diaphragmatic spread of right pleural primary. 4.  Atherosclerosis,  including within the coronary arteries. 5. Low right jugular node is mildly enlarged and suspicious for metastatic disease. 6. Subtle sclerosis within lower right ribs. Favored to be related to remote trauma.   Electronically Signed   By: Abigail Miyamoto M.D.   On: 09/23/2014 12:43    ROSNegative except above although he has noticed some mild increasing shortness of breath  Blood pressure 98/61, pulse 73, temperature 98.1 F (36.7 C), temperature source Oral, resp. rate 17, SpO2 97 %. Physical ExamNo acute distress elderly pleasant vital signs stable afebrile lungs surprisingly bilateral breath sounds clear regular rate and rhythm abdomen minimal ascites soft minimal tenderness no pedal edema CT and labs reviewed  Assessment/Plan: Esophageal extrinsic compression secondary to mesothelioma Plan: The risks benefits methods and options as well as possible complications was extensively discussed with the patient and his family including his wife and after our prolonged conversation they have elected to proceed with stent placement attempt tomorrow with further workup and plans pending those findings  [place] E 09/24/2014, 3:22 PM

## 2014-09-24 NOTE — Progress Notes (Addendum)
[facility] PROGRESS NOTE  Chase Marsh HDQ:[id number] DOB: 1931/07/30 DOA: 09/23/2014 PCP: Leonard Downing, MD  Brief Summary  The patient is a 81 y.o. year-old male with history of mesothelioma of the right lung and pleura diagnosed in 2007 s/p chemotherapy, right parietal pleurectomy and XRT, now on hospice care, who presented with emesis, hematemesis, anorexia, heartburn. He tried to relieve the sensation of heartburn and nausea by heaving which did relieve his discomfort temporarily however he developed hematemesis. Initially had dark brownish black emesis but then he developed bright red blood, several mouthfuls.His bowels have been normal in frequency, consistency, and color but now he feels constipated with lower abdominal fullness. CT scan of the chest demonstrated significant mass effect upon the distal esophagus by pleural-based masses causing partial obstruction.  Gastroenterology and radiation oncology were consulted. He was made nothing by mouth and started on IV fluids pending further evaluation and decision-making regarding nutrition.  Assessment/Plan  Esophageal obstruction due to progressive malignancy. The treatment options include palliative radiation, stent placement by gastroenterology, or possible PEG tube placement. Agree that PEG tube placement would be complicated by the patient's existing tumor burden and by his ascites. Esophageal stent placement would be the fastest solution and given his decline.  Also a candidate for XRT treatment and this will be discussed with patient by Dr. Tammi Klippel. - Appreciate GI and XRT oncology assistance - Nothing by mouth  - Medications by IV  - IV fluids  - IV PPI  - Morphine for pain control  - Oral care  - I have notified hospice of patient's admission  BPH, stable, monitor for urinary retention.  Irregular heart rhythm, EKG with PACs, no A. Fib.  Hypokalemia, IV repletion  Malignant/metastatic  mesothelioma with malignant ascites.  Hospice care.  Severe protein calorie malnutrition, appreciate nutrition assistance. Plan to advance diet as quickly as possible.  Diet: Nothing by mouth Access: PIV IVF: Yes Proph: SCDs  Code Status: DO NOT RESUSCITATE Family Communication: patient and his wife Disposition Plan:  Pending possible esophageal stent placement or initiation of XRT   Consultants:  Gastroenterology, Dr. Watt Climes  XRT Oncology, Dr. Tammi Klippel  Procedures:  CT chest:  Progressive right worse than left pleural masses, significant mass effect upon the distal esophagus causing partial obstruction, worsening abdominal ascites with a transdiaphragmatic spread of a right pleural primary, enlarged right jugular lymph node suspicious for metastatic disease and evidence of remote trauma to the right lower ribs  Antibiotics:  none   HPI/Subjective:  States that his heartburn-like symptoms and throat pain have completely resolved. He feels comfortable. He still feels dehydrated. He has some abdominal bloating and feels like he is constipated.    Objective: Filed Vitals:   09/23/14 1244 09/23/14 1543 09/23/14 2152 09/24/14 0527  BP: 97/67 102/63 96/61 100/55  Pulse: 86 90 87 80  Temp: 98.4 F (36.9 C) 98.1 F (36.7 C) 98.2 F (36.8 C) 97.8 F (36.6 C)  TempSrc: Oral Oral Oral Oral  Resp: 20 18 18 18   SpO2: 94% 96% 94% 95%    Intake/Output Summary (Last 24 hours) at 09/24/14 1326 Last data filed at 09/24/14 1325  Gross per 24 hour  Intake      0 ml  Output    790 ml  Net   -790 ml   There were no vitals filed for this visit. There is no weight on file to calculate BMI.  Exam:   General:  Cachectic male, No acute distress  HEENT:  NCAT,  MMM  Cardiovascular:  IRRR, nl S1, S2 no mrg, 2+ pulses, warm extremities  Respiratory:  Rales versus pleural rub at the right base, no wheezes or rhonchi, no increased WOB  Abdomen:   NABS, soft, mildly distended,  nontender to palpation, some fullness in the mid abdomen and in the right upper quadrant  MSK:   Normal tone and bulk, trace pitting bilateral lower extremity edema  Neuro:  Grossly intact  Data Reviewed: Basic Metabolic Panel:  Recent Labs Lab 09/23/14 0905 09/24/14 0447  NA 138 139  K 3.3* 3.5  CL 99* 104  CO2 29 27  GLUCOSE 102* 85  BUN 8 7  CREATININE 0.68 0.73  CALCIUM 8.2* 7.5*  PHOS  --  2.5   Liver Function Tests:  Recent Labs Lab 09/23/14 0905 09/24/14 0447  AST 41  --   ALT 14*  --   ALKPHOS 108  --   BILITOT 0.6  --   PROT 6.5  --   ALBUMIN 2.7* 2.5*    Recent Labs Lab 09/23/14 0905  LIPASE 45   No results for input(s): AMMONIA in the last 168 hours. CBC:  Recent Labs Lab 09/23/14 0905 09/24/14 0447  WBC 4.5 4.3  NEUTROABS 3.0  --   HGB 13.7 11.8*  HCT 41.5 37.0*  MCV 91.2 92.0  PLT 194 180    No results found for this or any previous visit (from the past 240 hour(s)).   Studies: Ct Chest W Contrast  09/23/2014   CLINICAL DATA:  Epigastric burning/ pain without relief. Vomiting. History of end-stage mesothelioma. Esophageal pain.  EXAM: CT CHEST WITH CONTRAST  TECHNIQUE: Multidetector CT imaging of the chest was performed during intravenous contrast administration.  CONTRAST:  30mL OMNIPAQUE IOHEXOL 300 MG/ML  SOLN  COMPARISON:  02/09/2014 abdominal pelvic CT. Chest radiograph of 07/16/2013. Most recent chest CT of 09/15/2005.  FINDINGS: Mediastinum/Nodes: Low right jugular 1.0 cm node on image 9 is suspicious for nodal metastasis. Tortuous thoracic aorta. Mild cardiomegaly with multivessel coronary artery atherosclerosis. No central pulmonary embolism, on this non-dedicated study.  No mediastinal or hilar adenopathy. The esophagus is compressed by pleural base masses, including on image 44 of series 2. No contrast extravasation. The more cephalad esophagus is contrast filled and dilated, including on image 15 of series 2. A small hiatal hernia.   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 Right-sided pleural-based masses, consistent with the clinical history of mesothelioma. An index low anterior right pleural mass measures 3.7 x 3.0 cm on image 33. Enlarged from 1.5 x 1.3 cm on 02/09/2014 (when remeasured). Left-sided pleural based mass or node measures 3.4 x 1.9 cm on image 51 versus 1.0 x 2.2 cm on 02/09/2014.  Mild degradation secondary to patient arms not being raised above the head.  No pulmonary parenchymal abnormality.  Upper abdomen: Normal imaged portions of the liver, spleen, stomach, right adrenal gland. New soft tissue mass within the right upper quadrant, including on image 45 of series 2. This surrounds the hepatic flexure colon. Concurrent new upper abdominal ascites.  Musculoskeletal: Subtle sclerosis involving lower lateral right ribs, including on image 53 of series 2.  IMPRESSION: 1. Progressive right worse than left pleural masses, consistent with the clinical history of mesothelioma. 2. Significant mass effect upon the distal esophagus by pleural-based masses. This causes partial obstruction, as evidenced by dilatation and contrast superiorly. 3. Development of right upper quadrant peritoneal mass and abdominal ascites. This is consistent with  either abdominal metastatic disease or tran diaphragmatic spread of right pleural primary. 4.  Atherosclerosis, including within the coronary arteries. 5. Low right jugular node is mildly enlarged and suspicious for metastatic disease. 6. Subtle sclerosis within lower right ribs. Favored to be related to remote trauma.   Electronically Signed   By: Abigail Miyamoto M.D.   On: 09/23/2014 12:43    Scheduled Meds: . levothyroxine  12.5 mcg Intravenous Daily  . pantoprazole (PROTONIX) IV  40 mg Intravenous BID   Continuous Infusions: . dextrose 5 % and 0.45 % NaCl with KCl 40 mEq/L 100 mL/hr at 09/24/14 0702    Principal Problem:    Esophageal obstruction Active Problems:   Esophageal pain   Malignant mesothelioma of pleura   GERD (gastroesophageal reflux disease)   Hematemesis   Gastroesophageal reflux disease with esophagitis   Protein-calorie malnutrition, severe    Time spent: 30 min    Macklin Jacquin, [facility] Pager [phone]. If 7PM-7AM, please contact night-coverage at [email or web address], password [facility] 09/24/2014, 1:26 PM  LOS: 1 day

## 2014-09-24 NOTE — Consult Note (Addendum)
[place]          [id number] ________________________________  Initial Inpatient Consultation  Name: Chase Marsh  MRN: [id number]  Date: 09/23/2014  DOB: [birth year]  REFERRING PHYSICIAN: Janece Canterbury, MD  DIAGNOSIS: 81 yo gentleman with distal esophageal obstruction from end stage widely metastatic mesothelioma - Stage IV    ICD-9-CM ICD-10-CM  1. History of malignant mesothelioma V10.89 Z85.89    HISTORY OF PRESENT ILLNESS::Chase Marsh is a 81 y.o. retired former Lexicographer, who lives in [place], with his wife, [name]. He also lists his son, Chase Marsh, in [place] as an additional contact at [phone]. Chase Marsh has followed with Dr. Lerry Marsh of [facility]. He was referred to [facility] by Dr. Marlyn Marsh and his primary care physician is Dr. Claris Marsh.  He has also been seen by Dr. Carolan Marsh.  Chase Marsh is seen with the diagnosis of epithelial variant malignant mesothelioma.   Course of his disease excerpted from records at [facility]:  1. Biopsy - Epithelial variant malignant mesothelioma diagnosed 09/21/05 by thoracoscopic biopsy of the pleura and diaphragm. Stage IV (T4N0M1) CT mesothelioma protocol at [facility] 09/30/05 with small right pleural effusion, multifocal pleural nodularity consistent with asbestos related pleural disease, right greater than left apical pleural parenchymal thickening. Mild nodular left adrenal gland without mass. Abdominal pelvic CT 10/13/05 with calcified diaphragmatic pleural plaque consistent with asbestos pleural disease. Trace right pleural effusion, fatty infiltration of the liver and benign prostatic hypertrophy.   2. Neoadjuvant Chemo - Initiation of chemotherapy with cisplatin, Alimta with Neulasta support given in the outpatient clinic on day 2, chemotherapy x 3 cycles starte 10/19/05  3. Surgery - Right Parietal Pleurectomy 01/25/2006    Recurrent mesothelioma documented by  CT guided biopsy, 08/09/08.   4.  Salvage Chemo - 6 Cycles carboplatin AUC of 5 and Alimta at 500 mg/m2 with Neulasta on day 2, 09/25/08-01/07/09.    Repeat CT scan 02/19/09, no evidence of progression of disease.    Repeat CT scan 05/14/09, slight interval increase in two pulmonary nodules in the right lung; otherwise stable.    Repeat CT scan 09/16/09 with interval increase in nodularity along the right fissures and diaphragmatic pleural surface, series 2, image 38, nodule previously measured 9 mm, now 12 mm, ground glass nodule, right upper lobe, series 2, image 25, 8 mm versus 5 mm prior, nodularity on the diaphragmatic pleura, image 47 previously image 49 previously 8 mm now 11 x 15 mm.    Chest CT, abdomen CT 04/08/2010 compared to 01/07/2010 relatively stable despite slight minimal increase in the size of previously seen multiple right pulmonary nodules. No lymphadenopathy. Slight interval progression of nodules and right-sided pleural thickening.    Chest CT, 03/03/11, increased soft tissue nodularity through the right pleural space, representative measurement inferior right pleura 15 mm versus 10 mm, 6 mm. Soft tissue to the right of the distal esophagus 26 x 15 mm versus 20 x 43 mm on recent exam.    Chest CT, 09/01/11, with increase in the right lung pleural thickening and nodularity.    Chest CT 05/02/12 significant interval progression of mesothelioma  5. Salvage Systemic Protocol Therapy on Med Immune protocol with Tremelimumab vs placebo 07/13/12   Chest CT 10/04/12 stable   Chest CT 10/31/12 stable   Chest CT 12/26/12- slight disease progression in multiple right lateral chest wall lesions with some central necrosis, does not meet criteria for disease progression  Chest CT 03/21/13 Multiple pleural and fissural nodules and masses throughout the right hemithorax, including a large confluent mass which invades the right lateral chest wall and upper abdomen, slightly increased in size  in the interval.    CT- 05/02/13-disease progression, removed from study  6. Salvage Chemotherapy with Carboplatin/Alimta/Neulasta x 6 cycles, then maintenance Alimta   CT- 07/04/13- significant improvement in extent of pleural soft tissue masses, chemo therapy continued with cycle 4   CT-09/05/13- slight further improvement in multifocal right pleural and chest wall soft tissue masses, overall stable after 6 cycles of carboplatin and Alimta, continue with Alimta maintenance   Chest CT December 04, 2013 extension of right anterior lateral basilar pleural mass across the diaphragm into perihepatic soft tissue with adjacent chest wall invasion  7. Radiation right pleural space 36 Gy in 3 Gy per fraction 12/26/13-01/11/14   PET/CT-03/05/2014-right pleural thickening consistent with mesothelioma, in the right posterior lateral and anterior chest wall, peritoneal carcinomatosis involving the supra hepatic region and hepatic capsule as well as right inguinal canal and left iliacus muscle.  8. Salvage Protocol Systemic Therapy BMS Mesothelin Phase One protocol 1.6 mg/kg 04/03/14    CAP CT- May 15, 2014 progressive disease along the upper abdomen wall along with peritoneal carcinomatosis, mass and left iliacus muscle is 2.3 x 3.5 cm   CAP CT--Jul 24, 2014-interval worsening of the right pleural nodularity, and diffuse metastatic disease with peritoneal carcinomatosis with mass in the left iliacus muscle and right inguinal canal  9. Radiation to three sites  Lt pelvic sidewall LAO/RPO  36 Gy in 3 Gy per fraction  08/08/14-08/24/14 Right Inguinal/Scrotal IMRT 36 Gy in 3 Gy per fraction 08/08/14-08/24/14 Right Flank IMRT  30 Gy in 3 Gy per fraction 08/08/14-08/22/14   This most recent course of radiation was notable for hospital admission for dehydration and anorexia.  Following completion of radiation, the Chase Marsh was enrolled into hospice with [facility].  He has had  progressive dysphagia and was admitted to [facility].  CT Chest/Abd/Pelvis showed extrinsic pleural masses narrowing the distal esophagus causing obstruction.  He has kindly been referred today to discuss palliative radiotherapy.  PREVIOUS RADIATION THERAPY: Yes as above  Past Medical History  Diagnosis Date  . Bladder outlet obstruction   . BPH (benign prostatic hypertrophy) with urinary obstruction   . Hyperlipidemia   . History of lung cancer RIGHT LOWER LOBE- MESOTHELIOMA---  2007  . Frequent urination   . Urgency of urination   . Nocturia   . Shortness of breath on exertion   . Chase Marsh able to exercise   . Malignant mesothelioma of pleura ONCOLOGIST-  DR Sharlet Salina AT [facility]-----  DX  JULY 2007--  S/P CHEMORADIATION , LUNG SURG. RIGHT LOWER LUBE    RECURRENCE 2010 W/ CHEMO THERAPY  . Kidney stone   . Pneumothorax     1968  . Chronic headaches   . GERD (gastroesophageal reflux disease)   :  Past Surgical History  Procedure Laterality Date  . Transurethral resection of prostate  SEPT  2008  . Knee arthroscopy  1994    RIGHT  . Right vat/ pleural bx/ diaphragmatic bx/ drainage pleural effusion  09-21-2005  . Thoracentesis  09-18-2005    RIGHT LOWER LOBE EFFUSION  . Right lung lining removed  NOV  2007  . Transurethral resection of prostate  04/02/2011    Procedure: TRANSURETHRAL RESECTION OF THE PROSTATE WITH GYRUS INSTRUMENTS;  Surgeon: Ailene Rud, MD;  Location: [facility];  Service: Urology;  Laterality: Left;  GYRUS    :   Current facility-administered medications:  .  bisacodyl (DULCOLAX) suppository 10 mg, 10 mg, Rectal, Daily PRN, Chase Canterbury, MD .  dextrose 5 % and 0.45 % NaCl with KCl 40 mEq/L infusion, , Intravenous, Continuous, Chase Canterbury, MD, Last Rate: 100 mL/hr at 09/24/14 0702 .  Glycerin (Adult) 2.1 G suppository 1 suppository, 1 suppository, Rectal, Daily PRN, Chase Canterbury, MD .  levothyroxine (SYNTHROID,  LEVOTHROID) injection 12.5 mcg, 12.5 mcg, Intravenous, Daily, Chase Canterbury, MD, 12.5 mcg at 09/24/14 0959 .  morphine 2 MG/ML injection 0.5-1 mg, 0.5-1 mg, Intravenous, Q1H PRN, Chase Canterbury, MD .  pantoprazole (PROTONIX) injection 40 mg, 40 mg, Intravenous, BID, Chase Canterbury, MD, 40 mg at 09/24/14 0959:  Allergies  Allergen Reactions  . Penicillins Rash  . Sulfa Antibiotics Rash  :  Family History  Problem Relation Age of Onset  . Cancer Mother   . Heart disease Sister     pacemaker  :  History   Social History  . Marital Status: Married    Spouse Name: N/A  . Number of Children: N/A  . Years of Education: N/A   Occupational History  . Not on file.   Social History Main Topics  . Smoking status: Former Smoker -- 50 years    Types: Cigarettes    Quit date: 07/28/2001  . Smokeless tobacco: Former Systems developer  . Alcohol Use: No  . Drug Use: No  . Sexual Activity: No   Other Topics Concern  . Not on file   Social History Narrative  :  REVIEW OF SYSTEMS:  A 15 point review of systems is documented in the electronic medical record. This was obtained by the nursing staff. However, I reviewed this with the Chase Marsh to discuss relevant findings and make appropriate changes.  Pertinent items are noted in HPI.   PHYSICAL EXAM:  Blood pressure 98/61, pulse 73, temperature 98.1 F (36.7 C), temperature source Oral, resp. rate 17, SpO2 97 %. per hospitalist  General: Cachectic male, No acute distress  HEENT: NCAT, MMM  Cardiovascular: IRRR, nl S1, S2 no mrg, 2+ pulses, warm extremities  Respiratory: Rales versus pleural rub at the right base, no wheezes or rhonchi, no increased WOB  Abdomen: NABS, soft, mildly distended, nontender to palpation, some fullness in the mid abdomen and in the right upper quadrant  MSK: Normal tone and bulk, trace pitting bilateral lower extremity edema  Neuro: Grossly intact   KPS = 50  100 - Normal; no complaints; no  evidence of disease. 90   - Able to carry on normal activity; minor signs or symptoms of disease. 80   - Normal activity with effort; some signs or symptoms of disease. 78   - Cares for self; unable to carry on normal activity or to do active work. 60   - Requires occasional assistance, but is able to care for most of his personal needs. 50   - Requires considerable assistance and frequent medical care. 39   - Disabled; requires special care and assistance. 48   - Severely disabled; hospital admission is indicated although death not imminent. 63   - Very sick; hospital admission necessary; active supportive treatment necessary. 10   - Moribund; fatal processes progressing rapidly. 0     - Dead  Karnofsky DA, Abelmann WH, Craver LS and Burchenal [facility] [phone]) The use of the nitrogen mustards in the palliative treatment of  carcinoma: with particular reference to bronchogenic carcinoma Cancer 1 [phone]  LABORATORY DATA:  Lab Results  Component Value Date   WBC 4.3 09/24/2014   HGB 11.8* 09/24/2014   HCT 37.0* 09/24/2014   MCV 92.0 09/24/2014   PLT 180 09/24/2014   Lab Results  Component Value Date   NA 139 09/24/2014   K 3.5 09/24/2014   CL 104 09/24/2014   CO2 27 09/24/2014   Lab Results  Component Value Date   ALT 14* 09/23/2014   AST 41 09/23/2014   ALKPHOS 108 09/23/2014   BILITOT 0.6 09/23/2014     RADIOGRAPHY: Ct Chest W Contrast  09/23/2014   CLINICAL DATA:  Epigastric burning/ pain without relief. Vomiting. History of end-stage mesothelioma. Esophageal pain.  EXAM: CT CHEST WITH CONTRAST  TECHNIQUE: Multidetector CT imaging of the chest was performed during intravenous contrast administration.  CONTRAST:  23mL OMNIPAQUE IOHEXOL 300 MG/ML  SOLN  COMPARISON:  02/09/2014 abdominal pelvic CT. Chest radiograph of 07/16/2013. Most recent chest CT of 09/15/2005.  FINDINGS: Mediastinum/Nodes: Low right jugular 1.0 cm node on image 9 is suspicious for nodal metastasis. Tortuous thoracic  aorta. Mild cardiomegaly with multivessel coronary artery atherosclerosis. No central pulmonary embolism, on this non-dedicated study.  No mediastinal or hilar adenopathy. The esophagus is compressed by pleural base masses, including on image 44 of series 2. No contrast extravasation. The more cephalad esophagus is contrast filled and dilated, including on image 15 of series 2. A small hiatal hernia.  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 Right-sided pleural-based masses, consistent with the clinical history of mesothelioma. An index low anterior right pleural mass measures 3.7 x 3.0 cm on image 33. Enlarged from 1.5 x 1.3 cm on 02/09/2014 (when remeasured). Left-sided pleural based mass or node measures 3.4 x 1.9 cm on image 51 versus 1.0 x 2.2 cm on 02/09/2014.  Mild degradation secondary to Chase Marsh arms not being raised above the head.  No pulmonary parenchymal abnormality.  Upper abdomen: Normal imaged portions of the liver, spleen, stomach, right adrenal gland. New soft tissue mass within the right upper quadrant, including on image 45 of series 2. This surrounds the hepatic flexure colon. Concurrent new upper abdominal ascites.  Musculoskeletal: Subtle sclerosis involving lower lateral right ribs, including on image 53 of series 2.  IMPRESSION: 1. Progressive right worse than left pleural masses, consistent with the clinical history of mesothelioma. 2. Significant mass effect upon the distal esophagus by pleural-based masses. This causes partial obstruction, as evidenced by dilatation and contrast superiorly. 3. Development of right upper quadrant peritoneal mass and abdominal ascites. This is consistent with either abdominal metastatic disease or tran diaphragmatic spread of right pleural primary. 4.  Atherosclerosis, including within the coronary arteries. 5. Low right jugular node is mildly enlarged and suspicious for metastatic disease. 6.  Subtle sclerosis within lower right ribs. Favored to be related to remote trauma.   Electronically Signed   By: Abigail Miyamoto M.D.   On: 09/23/2014 12:43      IMPRESSION: This Chase Marsh is a very nice 69 year old gentleman with a 9 year history of metastatic mesothelioma. After multiple forms of intervention including surgery, chemotherapy, protocol therapy, and previous radiation performed at [facility], he has enrolled into hospice care locally in [place]. A few days into his hospice enrollment, the Chase Marsh began experiencing progressive dysphagia.  He feels intense chest burning and then regurgitates solid food and liquids minutes after intake.  His vomiting episodes have progressively  been associated with hematemesis.  CT shows a discrete site of obstruction in the distal 5-8 cm of the esophagus related to extrinsic pleural masses and some esophageal thickening.  Given the discrete site of his symptoms, focal radiotherapy could be considered for palliation.  PLAN:Today, I talked to the Chase Marsh and family about the findings and work-up thus far.  We discussed malignant esophageal obstruction from cancer and general treatment, highlighting the role of radiotherapy in the management.  We discussed the available radiation techniques, and focused on the details of logistics and delivery.  We reviewed the anticipated acute and late sequelae associated with radiation in this setting.  We also discussed other options, like endoscopy and possible stent placement.    GI consult is pending.  Actually Dr. Watt Climes arrived just as I was completing my assessment.  We will await his input.  If stent is a possible option, we may delay consideration for radiotherapy.  I spent more than 50% of today's visit in counseling and/or coordination of care.    ------------------------------------------------  Sheral Apley. Tammi Klippel, M.D.  Pager [phone]      ADDENDUM:  Dr. Perley Jain note reviewed.  Will await result of  pending endoscopy.

## 2014-09-24 NOTE — Progress Notes (Signed)
Clinical Social Work  CSW received referral due to patient being admitted with hospice services. CSW will continue to follow and can be contacted if further needs arise.  [place], [place] [phone]

## 2014-09-25 ENCOUNTER — Encounter (HOSPITAL_COMMUNITY)
Admission: EM | Disposition: A | Discharge status: Home / Self Care | Payer: Self-pay | Source: Home / Self Care | Attending: Internal Medicine

## 2014-09-25 ENCOUNTER — Inpatient Hospital Stay (HOSPITAL_COMMUNITY): Admitting: Anesthesiology

## 2014-09-25 ENCOUNTER — Inpatient Hospital Stay (HOSPITAL_COMMUNITY)

## 2014-09-25 ENCOUNTER — Encounter (HOSPITAL_COMMUNITY): Payer: Self-pay | Admitting: *Deleted

## 2014-09-25 ENCOUNTER — Ambulatory Visit
Admit: 2014-09-25 | Discharge: 2014-09-25 | Disposition: A | Discharge status: Home / Self Care | Payer: Medicare Other | Attending: Radiation Oncology | Admitting: Radiation Oncology

## 2014-09-25 DIAGNOSIS — C450 Mesothelioma of pleura: Secondary | ICD-10-CM

## 2014-09-25 DIAGNOSIS — R188 Other ascites: Secondary | ICD-10-CM

## 2014-09-25 HISTORY — PX: ESOPHAGOGASTRODUODENOSCOPY (EGD) WITH PROPOFOL: SHX5813

## 2014-09-25 HISTORY — PX: ESOPHAGEAL STENT PLACEMENT: SHX5540

## 2014-09-25 LAB — CBC
HCT: 38.4 % — ABNORMAL LOW (ref 39.0–52.0)
Hemoglobin: 12.5 g/dL — ABNORMAL LOW (ref 13.0–17.0)
MCH: 29.8 pg (ref 26.0–34.0)
MCHC: 32.6 g/dL (ref 30.0–36.0)
MCV: 91.6 fL (ref 78.0–100.0)
PLATELETS: 171 10*3/uL (ref 150–400)
RBC: 4.19 MIL/uL — ABNORMAL LOW (ref 4.22–5.81)
RDW: 15.3 % (ref 11.5–15.5)
WBC: 4 10*3/uL (ref 4.0–10.5)

## 2014-09-25 LAB — RENAL FUNCTION PANEL
ANION GAP: 8 (ref 5–15)
Albumin: 2.4 g/dL — ABNORMAL LOW (ref 3.5–5.0)
BUN: 6 mg/dL (ref 6–20)
CO2: 28 mmol/L (ref 22–32)
Calcium: 7.8 mg/dL — ABNORMAL LOW (ref 8.9–10.3)
Chloride: 103 mmol/L (ref 101–111)
Creatinine, Ser: 0.71 mg/dL (ref 0.61–1.24)
GFR calc Af Amer: >60 mL/min (ref >60)
GFR calc non Af Amer: >60 mL/min (ref >60)
GLUCOSE: 87 mg/dL (ref 65–99)
Phosphorus: 2 mg/dL — ABNORMAL LOW (ref 2.5–4.6)
Potassium: 4.2 mmol/L (ref 3.5–5.1)
Sodium: 139 mmol/L (ref 135–145)

## 2014-09-25 LAB — MAGNESIUM: Magnesium: 1.1 mg/dL — ABNORMAL LOW (ref 1.7–2.4)

## 2014-09-25 SURGERY — ESOPHAGOGASTRODUODENOSCOPY (EGD) WITH PROPOFOL
Anesthesia: General

## 2014-09-25 MED ORDER — FENTANYL CITRATE (PF) 100 MCG/2ML IJ SOLN
INTRAMUSCULAR | Status: AC
  Filled 2014-09-25: qty 2

## 2014-09-25 MED ORDER — GLYCOPYRROLATE 0.2 MG/ML IJ SOLN
INTRAMUSCULAR | Status: DC | PRN
  Administered 2014-09-25: 0.2 mg via INTRAVENOUS

## 2014-09-25 MED ORDER — LIDOCAINE HCL (CARDIAC) 20 MG/ML IV SOLN
INTRAVENOUS | Status: AC
  Filled 2014-09-25: qty 5

## 2014-09-25 MED ORDER — GI COCKTAIL ~~LOC~~
30.0000 mL | Freq: Three times a day (TID) | ORAL | Status: DC | PRN
  Filled 2014-09-25: qty 30

## 2014-09-25 MED ORDER — PROPOFOL 10 MG/ML IV BOLUS
INTRAVENOUS | Status: DC | PRN
  Administered 2014-09-25: 150 mg via INTRAVENOUS

## 2014-09-25 MED ORDER — PHENYLEPHRINE HCL 10 MG/ML IJ SOLN
INTRAMUSCULAR | Status: DC | PRN
  Administered 2014-09-25: 80 ug via INTRAVENOUS

## 2014-09-25 MED ORDER — POTASSIUM PHOSPHATES 15 MMOLE/5ML IV SOLN
20.0000 meq | Freq: Once | INTRAVENOUS | Status: AC
  Administered 2014-09-25: 20 meq via INTRAVENOUS
  Filled 2014-09-25: qty 4.55

## 2014-09-25 MED ORDER — ONDANSETRON HCL 4 MG/2ML IJ SOLN
4.0000 mg | Freq: Four times a day (QID) | INTRAMUSCULAR | Status: DC | PRN
Start: 2014-09-25 — End: 2014-09-30
  Administered 2014-09-25 – 2014-09-26 (×4): 4 mg via INTRAVENOUS
  Filled 2014-09-25 (×4): qty 2

## 2014-09-25 MED ORDER — LACTATED RINGERS IV SOLN
INTRAVENOUS | Status: DC | PRN
  Administered 2014-09-25: 08:00:00 via INTRAVENOUS

## 2014-09-25 MED ORDER — KCL IN DEXTROSE-NACL 20-5-0.45 MEQ/L-%-% IV SOLN
INTRAVENOUS | Status: DC
  Administered 2014-09-25 – 2014-09-30 (×10): via INTRAVENOUS
  Filled 2014-09-25 (×14): qty 1000

## 2014-09-25 MED ORDER — PROPOFOL 10 MG/ML IV BOLUS
INTRAVENOUS | Status: AC
  Filled 2014-09-25: qty 20

## 2014-09-25 MED ORDER — LIDOCAINE HCL (CARDIAC) 20 MG/ML IV SOLN
INTRAVENOUS | Status: DC | PRN
  Administered 2014-09-25: 25 mg via INTRATRACHEAL
  Administered 2014-09-25: 75 mg via INTRAVENOUS

## 2014-09-25 MED ORDER — LACTATED RINGERS IV SOLN
INTRAVENOUS | Status: DC
  Administered 2014-09-25: 1000 mL via INTRAVENOUS

## 2014-09-25 MED ORDER — PROMETHAZINE HCL 25 MG/ML IJ SOLN
12.5000 mg | Freq: Four times a day (QID) | INTRAMUSCULAR | Status: DC | PRN
  Administered 2014-09-25 – 2014-09-26 (×5): 12.5 mg via INTRAVENOUS
  Filled 2014-09-25 (×6): qty 1

## 2014-09-25 MED ORDER — PHENOL 1.4 % MT LIQD
1.0000 | OROMUCOSAL | Status: DC | PRN
  Filled 2014-09-25: qty 177

## 2014-09-25 MED ORDER — BOOST / RESOURCE BREEZE PO LIQD
1.0000 | Freq: Three times a day (TID) | ORAL | Status: DC
  Administered 2014-09-25 – 2014-09-26 (×3): 1 via ORAL

## 2014-09-25 MED ORDER — FENTANYL CITRATE (PF) 100 MCG/2ML IJ SOLN
INTRAMUSCULAR | Status: DC | PRN
  Administered 2014-09-25 (×2): 25 ug via INTRAVENOUS

## 2014-09-25 MED ORDER — FAMOTIDINE IN NACL 20-0.9 MG/50ML-% IV SOLN
20.0000 mg | Freq: Once | INTRAVENOUS | Status: AC
  Administered 2014-09-25: 20 mg via INTRAVENOUS
  Filled 2014-09-25: qty 50

## 2014-09-25 MED ORDER — ONDANSETRON HCL 4 MG/2ML IJ SOLN
INTRAMUSCULAR | Status: DC | PRN
  Administered 2014-09-25: 4 mg via INTRAVENOUS

## 2014-09-25 MED ORDER — GLYCOPYRROLATE 0.2 MG/ML IJ SOLN
INTRAMUSCULAR | Status: AC
  Filled 2014-09-25: qty 1

## 2014-09-25 MED ORDER — SODIUM CHLORIDE 0.9 % IV SOLN: INTRAVENOUS | Status: DC

## 2014-09-25 MED ORDER — MAGNESIUM SULFATE 2 GM/50ML IV SOLN
2.0000 g | Freq: Once | INTRAVENOUS | Status: AC
  Administered 2014-09-25: 2 g via INTRAVENOUS
  Filled 2014-09-25: qty 50

## 2014-09-25 MED ORDER — SUCCINYLCHOLINE CHLORIDE 20 MG/ML IJ SOLN
INTRAMUSCULAR | Status: DC | PRN
  Administered 2014-09-25: 80 mg via INTRAVENOUS

## 2014-09-25 SURGICAL SUPPLY — 15 items

## 2014-09-25 NOTE — Transfer of Care (Signed)
Immediate Anesthesia Transfer of Care Note  Patient: Chase SCHOFFSTALL  Procedure(s) Performed: Procedure(s): ESOPHAGOGASTRODUODENOSCOPY (EGD) WITH PROPOFOL (N/A) ESOPHAGEAL STENT PLACEMENT (N/A)  Patient Location: PACU  Anesthesia Type:General  Level of Consciousness: awake, sedated and patient cooperative  Airway & Oxygen Therapy: Patient Spontanous Breathing and Patient connected to face mask oxygen  Post-op Assessment: Report given to RN, Post -op Vital signs reviewed and stable and Patient moving all extremities X 4  Post vital signs: stable  Last Vitals:  Filed Vitals:   09/25/14 0810  BP: 116/77  Pulse:   Temp: 36.6 C  Resp: 22    Complications: No apparent anesthesia complications

## 2014-09-25 NOTE — Addendum Note (Signed)
Addendum  created 09/25/14 1424 by Lissa Morales, CRNA   Modules edited: Charges VN

## 2014-09-25 NOTE — Progress Notes (Signed)
Chase Marsh 9:00 AM  Subjective: Patient without any new complaints and his procedure was rediscussed with he and his wife and they have no additional questions  Objective: Vital signs stable afebrile no acute distress exam please see preassessment evaluation labs stable  Assessment: Esophageal obstruction seemingly extrinsic compression  Plan: Okay to proceed with endoscopy and probable stent placement with anesthesia assistance  [facility] E  Pager [phone] After 5PM or if no answer call [phone]

## 2014-09-25 NOTE — Anesthesia Postprocedure Evaluation (Signed)
  Anesthesia Post-op Note  Patient: Chase Marsh  Procedure(s) Performed: Procedure(s) (LRB): ESOPHAGOGASTRODUODENOSCOPY (EGD) WITH PROPOFOL (N/A) ESOPHAGEAL STENT PLACEMENT (N/A)  Patient Location: PACU  Anesthesia Type: General  Level of Consciousness: awake and alert   Airway and Oxygen Therapy: Patient Spontanous Breathing  Post-op Pain: mild  Post-op Assessment: Post-op Vital signs reviewed, Patient's Cardiovascular Status Stable, Respiratory Function Stable, Patent Airway and No signs of Nausea or vomiting  Last Vitals:  Filed Vitals:   09/25/14 0954  BP: 119/81  Pulse:   Temp: 36.7 C  Resp:     Post-op Vital Signs: stable   Complications: No apparent anesthesia complications

## 2014-09-25 NOTE — Progress Notes (Signed)
[facility]-RN Visit-Stacie Delice Lesch RN, BSN  This is a related, covered admission to [facility] diagnosis of mesothelioma.  Patient presented to the ED after multiple episodes of hemoptysis and inability to keep any fluids down.  [facility] was notified and offered a visit at the home, however wife wanted patient evaluated in the ED.  Patient is a DNR. Patient seen in room sleeping with wife, [place] at bedside.  Wife reports patient had an esophageal stent placed this morning to hopefully alleviate his inability to swallow.  Patient remains lethargic most likely from the anesthesia.  IVF noted to be infusing via a PIV.  Patient has received 1 dose of Zofran for nausea per wife.  Family reported that [place], [place] worker had made a visit earlier today.  Plan is to discharge home with hospice, after issues with swallowing have been addressed.  [facility] medication list and transfer summary placed on chart.  Please call [facility] with any questions or concerns.  [facility] Liaison [phone]

## 2014-09-25 NOTE — Anesthesia Procedure Notes (Signed)
Procedure Name: Intubation Date/Time: 09/25/2014 9:10 AM Performed by: Lissa Morales Pre-anesthesia Checklist: Patient identified, Emergency Drugs available, Suction available and Patient being monitored Patient Re-evaluated:Patient Re-evaluated prior to inductionOxygen Delivery Method: Circle System Utilized Preoxygenation: Pre-oxygenation with 100% oxygen Intubation Type: IV induction Ventilation: Mask ventilation without difficulty Laryngoscope Size: Mac and 4 Grade View: Grade II Tube type: Oral Tube size: 7.5 mm Number of attempts: 1 Airway Equipment and Method: Stylet and Oral airway Placement Confirmation: ETT inserted through vocal cords under direct vision,  positive ETCO2 and breath sounds checked- equal and bilateral Secured at: 21 cm Tube secured with: Tape Dental Injury: Teeth and Oropharynx as per pre-operative assessment

## 2014-09-25 NOTE — Progress Notes (Signed)
[facility] PROGRESS NOTE  Chase Marsh WUJ:[id number] DOB: March 03, 1932 DOA: 09/23/2014 PCP: Leonard Downing, MD  Brief Summary  The patient is a 81 y.o. year-old male with history of mesothelioma of the right lung and pleura diagnosed in 2007 s/p chemotherapy, right parietal pleurectomy and XRT, now on hospice care, who presented with emesis, hematemesis, anorexia, heartburn. He tried to relieve the sensation of heartburn and nausea by heaving which did relieve his discomfort temporarily however he developed hematemesis. Initially had dark brownish black emesis but then he developed bright red blood, several mouthfuls.His bowels have been normal in frequency, consistency, and color but now he feels constipated with lower abdominal fullness. CT scan of the chest demonstrated significant mass effect upon the distal esophagus by pleural-based masses causing partial obstruction.  Gastroenterology and radiation oncology were consulted. He was made nothing by mouth and started on IV fluids pending further evaluation and decision-making regarding nutrition.  Assessment/Plan  Esophageal obstruction due to progressive malignancy s/p esophageal stent placement on 7/5 by Dr. Watt Climes.  Having severe post-procedure nausea with bilious emesis. - Appreciate GI and XRT oncology assistance -  Diet per GI -  Zofran/phenergan -  KUB  BPH, stable, monitor for urinary retention.  Irregular heart rhythm, EKG with PACs, no A. Fib.  Hypokalemia, IV repletion  Malignant/metastatic mesothelioma with malignant ascites.  Hospice care.  Severe protein calorie malnutrition, appreciate nutrition assistance.  -  Plan to advance diet as quickly as possible.  Diet: per GI Access: PIV IVF: d/c once tolerating PO Proph: SCDs  Code Status: DO NOT RESUSCITATE Family Communication: patient and his wife Disposition Plan:  Pending tolerating diet   Consultants:  Gastroenterology, Dr. Watt Climes  XRT  Oncology, Dr. Tammi Klippel  Procedures:  CT chest:  Progressive right worse than left pleural masses, significant mass effect upon the distal esophagus causing partial obstruction, worsening abdominal ascites with a transdiaphragmatic spread of a right pleural primary, enlarged right jugular lymph node suspicious for metastatic disease and evidence of remote trauma to the right lower ribs  Antibiotics:  none   HPI/Subjective:  Has severe pain in the back of his throat and heart burn.  Nausea is severe and has had some bilious emesis.  Had BM this morning.  Denies abdominal pain, fevers, chills.    Objective: Filed Vitals:   09/25/14 0739 09/25/14 0810 09/25/14 0954 09/25/14 1044  BP:  116/77 119/81 109/71  Pulse:    77  Temp:  97.9 F (36.6 C) 98 F (36.7 C) 97.7 F (36.5 C)  TempSrc:  Oral Oral Oral  Resp:  22  18  Height: 5\' 11"  (1.803 m)     Weight: 65.227 kg (143 lb 12.8 oz)     SpO2:  96%  96%    Intake/Output Summary (Last 24 hours) at 09/25/14 1158 Last data filed at 09/25/14 1024  Gross per 24 hour  Intake   1650 ml  Output   1050 ml  Net    600 ml   Filed Weights   09/25/14 0739  Weight: 65.227 kg (143 lb 12.8 oz)   Body mass index is 20.06 kg/(m^2).  Exam:   General:  Cachectic male, ill-appearing with perioral pallor, emesis basin at the ready  HEENT:  NCAT, MMM  Cardiovascular:  IRRR, nl S1, S2 no mrg, 2+ pulses, warm extremities  Respiratory:  Rales versus pleural rub at the right base, no wheezes or rhonchi, no increased WOB  Abdomen:   NABS, soft, mildly distended, nontender to  palpation, some fullness in the mid abdomen and in the right upper quadrant  MSK:   Normal tone and bulk, trace pitting bilateral lower extremity edema  Data Reviewed: Basic Metabolic Panel:  Recent Labs Lab 09/23/14 0905 09/24/14 0447 09/25/14 0510  NA 138 139 139  K 3.3* 3.5 4.2  CL 99* 104 103  CO2 29 27 28   GLUCOSE 102* 85 87  BUN 8 7 6   CREATININE 0.68 0.73  0.71  CALCIUM 8.2* 7.5* 7.8*  MG  --   --  1.1*  PHOS  --  2.5 2.0*   Liver Function Tests:  Recent Labs Lab 09/23/14 0905 09/24/14 0447 09/25/14 0510  AST 41  --   --   ALT 14*  --   --   ALKPHOS 108  --   --   BILITOT 0.6  --   --   PROT 6.5  --   --   ALBUMIN 2.7* 2.5* 2.4*    Recent Labs Lab 09/23/14 0905  LIPASE 45   No results for input(s): AMMONIA in the last 168 hours. CBC:  Recent Labs Lab 09/23/14 0905 09/24/14 0447 09/25/14 0510  WBC 4.5 4.3 4.0  NEUTROABS 3.0  --   --   HGB 13.7 11.8* 12.5*  HCT 41.5 37.0* 38.4*  MCV 91.2 92.0 91.6  PLT 194 180 171    No results found for this or any previous visit (from the past 240 hour(s)).   Studies: Dg Chest 1 View  09/25/2014   CLINICAL DATA:  Dysphagia.  Esophageal stent placement  EXAM: CHEST  1 VIEW  COMPARISON:  Chest CT 09/23/2014  FINDINGS: Three intraoperative spot images from endoscopy demonstrate endoscope within the esophagus. Final images demonstrate placement of mid to distal esophageal stent, extending to the GE junction.  IMPRESSION: Placement of mid to distal esophageal stent.   Electronically Signed   By: Rolm Baptise M.D.   On: 09/25/2014 10:10   Ct Chest W Contrast  09/23/2014   CLINICAL DATA:  Epigastric burning/ pain without relief. Vomiting. History of end-stage mesothelioma. Esophageal pain.  EXAM: CT CHEST WITH CONTRAST  TECHNIQUE: Multidetector CT imaging of the chest was performed during intravenous contrast administration.  CONTRAST:  32mL OMNIPAQUE IOHEXOL 300 MG/ML  SOLN  COMPARISON:  02/09/2014 abdominal pelvic CT. Chest radiograph of 07/16/2013. Most recent chest CT of 09/15/2005.  FINDINGS: Mediastinum/Nodes: Low right jugular 1.0 cm node on image 9 is suspicious for nodal metastasis. Tortuous thoracic aorta. Mild cardiomegaly with multivessel coronary artery atherosclerosis. No central pulmonary embolism, on this non-dedicated study.  No mediastinal or hilar adenopathy. The esophagus is  compressed by pleural base masses, including on image 44 of series 2. No contrast extravasation. The more cephalad esophagus is contrast filled and dilated, including on image 15 of series 2. A small hiatal hernia.  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 Right-sided pleural-based masses, consistent with the clinical history of mesothelioma. An index low anterior right pleural mass measures 3.7 x 3.0 cm on image 33. Enlarged from 1.5 x 1.3 cm on 02/09/2014 (when remeasured). Left-sided pleural based mass or node measures 3.4 x 1.9 cm on image 51 versus 1.0 x 2.2 cm on 02/09/2014.  Mild degradation secondary to patient arms not being raised above the head.  No pulmonary parenchymal abnormality.  Upper abdomen: Normal imaged portions of the liver, spleen, stomach, right adrenal gland. New soft tissue mass within the right upper quadrant, including on image 45  of series 2. This surrounds the hepatic flexure colon. Concurrent new upper abdominal ascites.  Musculoskeletal: Subtle sclerosis involving lower lateral right ribs, including on image 53 of series 2.  IMPRESSION: 1. Progressive right worse than left pleural masses, consistent with the clinical history of mesothelioma. 2. Significant mass effect upon the distal esophagus by pleural-based masses. This causes partial obstruction, as evidenced by dilatation and contrast superiorly. 3. Development of right upper quadrant peritoneal mass and abdominal ascites. This is consistent with either abdominal metastatic disease or tran diaphragmatic spread of right pleural primary. 4.  Atherosclerosis, including within the coronary arteries. 5. Low right jugular node is mildly enlarged and suspicious for metastatic disease. 6. Subtle sclerosis within lower right ribs. Favored to be related to remote trauma.   Electronically Signed   By: Abigail Miyamoto M.D.   On: 09/23/2014 12:43   Dg C-arm 1-60 Min-no  Report  09/25/2014   CLINICAL DATA: reflux; esophageal stent   C-ARM 1-60 MINUTES  Fluoroscopy was utilized by the requesting physician.  No radiographic  interpretation.     Scheduled Meds: . levothyroxine  12.5 mcg Intravenous Daily  . pantoprazole (PROTONIX) IV  40 mg Intravenous BID   Continuous Infusions: . dextrose 5 % and 0.45 % NaCl with KCl 40 mEq/L 100 mL/hr at 09/25/14 1049    Principal Problem:   Esophageal obstruction Active Problems:   Esophageal pain   Malignant mesothelioma of pleura   GERD (gastroesophageal reflux disease)   Hematemesis   Gastroesophageal reflux disease with esophagitis   Protein-calorie malnutrition, severe   Ascites    Time spent: 30 min    Alura Olveda, [facility] Pager [phone]. If 7PM-7AM, please contact night-coverage at [email or web address], password [facility] 09/25/2014, 11:58 AM  LOS: 2 days

## 2014-09-25 NOTE — Progress Notes (Signed)
  Radiation Oncology         [phone] ________________________________  Name: Chase Marsh  MRN: [id number]  Date: 09/23/2014  DOB: 08-20-1931  Chart Note:  Successful stent placement noted.  At this time, I would not recommend the addition of radiotherapy unless the patient is unable to tolerate PO diet.    ________________________________  Sheral Apley. Tammi Klippel, M.D.

## 2014-09-25 NOTE — Op Note (Signed)
[facility] [place], [address]   ENDOSCOPY PROCEDURE REPORT  PATIENT: Chase, Marsh  MR#: [id number] BIRTHDATE: Aug 05, 1931 , 83  yrs. old GENDER: male ENDOSCOPIST: Clarene Essex, MD REFERRED BY: PROCEDURE DATE:  Oct 14, 2014 PROCEDURE:  EGD w/ transendoscopic stent ASA CLASS:     Class III INDICATIONS:  dysphagia and abnormal abdominal x-ray. MEDICATIONS: Per Anesthesia TOPICAL ANESTHETIC: none  DESCRIPTION OF PROCEDURE: After the risks benefits and alternatives of the procedure were thoroughly explained, informed consent was obtained.  The endoscope endoscope was introduced through the mouth and advanced to the second portion of the duodenum , Without limitations.  The instrument was slowly withdrawn as the mucosa was fully examined. Estimated blood loss is zero unless otherwise noted in this procedure report.   see  report below       Retroflexed views revealed a hiatal hernia. The scope was then withdrawn from the patient and the procedure completed.  COMPLICATIONS: There were no immediate complications.  ENDOSCOPIC IMPRESSION: 1. Distal esophagus with small hiatal hernia and an achalasia-like appearance with a few small superficial ulcers but the scope passed easily into the stomach 2. Otherwise normal EGD 3. After much deliberation about placing a stent versus proceeding with more workup to include barium swallow and possibly manometry based on his CAT scan and clinical course we elected to place a 10 cm x 23 mm covered stent across the compression in the distal esophagus in the customary fashion using both fluoroscopy and endoscopic guidance and the top of the stent was at about 28 cm and the distal end of the stent was at the top of the stomach and the patient tolerated the procedure well there was no obvious immediate complication  RECOMMENDATIONS: customary post-stent orders antireflux teaching and pump inhibitors and dietary  consult for post stent dietary instructions  REPEAT EXAM: as needed  eSigned:  Clarene Essex, MD 10-14-2014 10:01 AM    CC:  CPT CODES: ICD CODES:  The ICD and CPT codes recommended by this software are interpretations from the data that the clinical staff has captured with the software.  The verification of the translation of this report to the ICD and CPT codes and modifiers is the sole responsibility of the health care institution and practicing physician where this report was generated.  [place]. will not be held responsible for the validity of the ICD and CPT codes included on this report.  AMA assumes no liability for data contained or not contained herein. CPT is a Designer, television/film set of the [employer].  PATIENT NAME:  Chase, Marsh MR#: [id number]

## 2014-09-25 NOTE — Anesthesia Preprocedure Evaluation (Addendum)
Anesthesia Evaluation  Patient identified by MRN, date of birth, ID band Patient awake    Reviewed: Allergy & Precautions, NPO status , Patient's Chart, lab work & pertinent test results  History of Anesthesia Complications Negative for: history of anesthetic complications  Airway Mallampati: II  TM Distance: >3 FB Neck ROM: Full    Dental no notable dental hx. (+) Dental Advisory Given, Edentulous Upper, Poor Dentition   Pulmonary former smoker,  Mesothelioma s/p resection, chemo and radiation, no home O2 breath sounds clear to auscultation  Pulmonary exam normal       Cardiovascular negative cardio ROS Normal cardiovascular examRhythm:Regular Rate:Normal     Neuro/Psych  Headaches, negative psych ROS   GI/Hepatic Neg liver ROS, GERD-  Medicated and Controlled,  Endo/Other  negative endocrine ROS  Renal/GU negative Renal ROS  negative genitourinary   Musculoskeletal negative musculoskeletal ROS (+)   Abdominal   Peds negative pediatric ROS (+)  Hematology negative hematology ROS (+)   Anesthesia Other Findings   Reproductive/Obstetrics negative OB ROS                            Anesthesia Physical Anesthesia Plan  ASA: III  Anesthesia Plan: General   Post-op Pain Management:    Induction: Intravenous, Rapid sequence and Cricoid pressure planned  Airway Management Planned: Oral ETT  Additional Equipment:   Intra-op Plan:   Post-operative Plan: Possible Post-op intubation/ventilation and Extubation in OR  Informed Consent: I have reviewed the patients History and Physical, chart, labs and discussed the procedure including the risks, benefits and alternatives for the proposed anesthesia with the patient or authorized representative who has indicated his/her understanding and acceptance.   Dental advisory given  Plan Discussed with: CRNA  Anesthesia Plan Comments: (Patient  has significant reflux since admission. Reports not even being able to lie flat without feeling acid climbing up esophagus. Had nausea and vomiting when admitted. Discussed r/b/o with patient and wife. Feel that given his significant reflux and length of surgery, would be prudent to perform an RSI with ETT. Discussed with patient possibility of post operative ventilation and he is acceptable of this and agreeable to the plan. )       Anesthesia Quick Evaluation

## 2014-09-26 ENCOUNTER — Encounter (HOSPITAL_COMMUNITY): Payer: Self-pay | Admitting: Gastroenterology

## 2014-09-26 LAB — RENAL FUNCTION PANEL
ANION GAP: 9 (ref 5–15)
Albumin: 2.6 g/dL — ABNORMAL LOW (ref 3.5–5.0)
BUN: 8 mg/dL (ref 6–20)
CHLORIDE: 102 mmol/L (ref 101–111)
CO2: 24 mmol/L (ref 22–32)
Calcium: 7.5 mg/dL — ABNORMAL LOW (ref 8.9–10.3)
Creatinine, Ser: 0.67 mg/dL (ref 0.61–1.24)
GFR calc Af Amer: >60 mL/min (ref >60)
GFR calc non Af Amer: >60 mL/min (ref >60)
GLUCOSE: 174 mg/dL — AB (ref 65–99)
Phosphorus: 3.1 mg/dL (ref 2.5–4.6)
Potassium: 4 mmol/L (ref 3.5–5.1)
Sodium: 135 mmol/L (ref 135–145)

## 2014-09-26 LAB — CBC
HEMATOCRIT: 38.7 % — AB (ref 39.0–52.0)
Hemoglobin: 12.7 g/dL — ABNORMAL LOW (ref 13.0–17.0)
MCH: 29.5 pg (ref 26.0–34.0)
MCHC: 32.8 g/dL (ref 30.0–36.0)
MCV: 89.8 fL (ref 78.0–100.0)
PLATELETS: 167 10*3/uL (ref 150–400)
RBC: 4.31 MIL/uL (ref 4.22–5.81)
RDW: 15 % (ref 11.5–15.5)
WBC: 6 10*3/uL (ref 4.0–10.5)

## 2014-09-26 MED ORDER — POLYETHYLENE GLYCOL 3350 17 G PO PACK
17.0000 g | PACK | Freq: Every day | ORAL | Status: DC
  Filled 2014-09-26 (×3): qty 1

## 2014-09-26 MED ORDER — SUCRALFATE 1 GM/10ML PO SUSP
1.0000 g | Freq: Three times a day (TID) | ORAL | Status: DC
  Administered 2014-09-27 – 2014-09-30 (×12): 1 g via ORAL
  Filled 2014-09-26 (×20): qty 10

## 2014-09-26 MED ORDER — PROCHLORPERAZINE EDISYLATE 5 MG/ML IJ SOLN
10.0000 mg | Freq: Four times a day (QID) | INTRAMUSCULAR | Status: DC | PRN
  Administered 2014-09-26: 10 mg via INTRAVENOUS
  Filled 2014-09-26: qty 2

## 2014-09-26 NOTE — Progress Notes (Signed)
[facility] PROGRESS NOTE  Chase Marsh KJZ:[id number] DOB: [birth year] DOA: 09/23/2014 PCP: Leonard Downing, MD   Brief Summary The patient is a 81 y.o. year-old male with history of mesothelioma of the right lung and pleura diagnosed in 2007 s/p chemotherapy, right parietal pleurectomy and XRT, now on hospice care, who presented with emesis, hematemesis, anorexia, heartburn. He tried to relieve the sensation of heartburn and nausea by heaving which did relieve his discomfort temporarily however he developed hematemesis. Initially had dark brownish black emesis but then he developed bright red blood, several mouthfuls.His bowels have been normal in frequency, consistency, and color but now he feels constipated with lower abdominal fullness. CT scan of the chest demonstrated significant mass effect upon the distal esophagus by pleural-based masses causing partial obstruction. Gastroenterology and radiation oncology were consulted. He was made nothing by mouth and started on IV fluids pending further evaluation and decision-making regarding nutrition.  Assessment/Plan: 1. Esophageal obstruction secondary to progressive malignancy 1. Pt is followed by GI 2. Pt is s/p stenting by Dr. Watt Climes on 7/5 3. Started trial of dysphagia 1 diet today, however pt noted to be nauseated still 4. Cont supportive care 5. Will consult SLP for diet recommendations 2. BHP 1. Stable 3. Irregular heart rhythm 1. PAC's noted on tele recently, not afib 4. Hypokalemia 1. Replaced, normalized 5. Metastatic mesothelioma 1. Under hospice care 6. Severe protein calorie malnutrition 1. Nutrition seen in consultation 2. SLP consulted for diet recommendation 7. DVT prophylaxis 1. SCD's  Code Status: DNR Family Communication: Pt in room, family at bedside (indicate person spoken with, relationship, and if by phone, the number) Disposition Plan: Home when able to tolerate  PO   Consultants:  GI  Procedures:  Esophageal stenting 7/5  Antibiotics:   (indicate start date, and stop date if known)  HPI/Subjective: Feels better. Denies nausea this AM before eating  Objective: Filed Vitals:   09/25/14 1622 09/25/14 2214 09/26/14 0509 09/26/14 1406  BP: 122/73 107/75 98/65 107/70  Pulse: 87 95 86 81  Temp: 97.7 F (36.5 C) 98.2 F (36.8 C) 98.1 F (36.7 C) 98.2 F (36.8 C)  TempSrc: Oral Oral Oral Oral  Resp: 20 17 20 18   Height:      Weight:      SpO2: 95% 94% 91% 93%    Intake/Output Summary (Last 24 hours) at 09/26/14 1541 Last data filed at 09/26/14 1300  Gross per 24 hour  Intake    222 ml  Output   1450 ml  Net  -1228 ml   Filed Weights   09/25/14 0739  Weight: 65.227 kg (143 lb 12.8 oz)    Exam:   General:  Awake, in nad  Cardiovascular: regular, s1,s2  Respiratory: normal resp effort, no wheezing  Abdomen: soft,nondistended  Musculoskeletal: perfused, no clubbing   Data Reviewed: Basic Metabolic Panel:  Recent Labs Lab 09/23/14 0905 09/24/14 0447 09/25/14 0510 09/26/14 0438  NA 138 139 139 135  Marsh 3.3* 3.5 4.2 4.0  CL 99* 104 103 102  CO2 29 27 28 24   GLUCOSE 102* 85 87 174*  BUN 8 7 6 8   CREATININE 0.68 0.73 0.71 0.67  CALCIUM 8.2* 7.5* 7.8* 7.5*  MG  --   --  1.1*  --   PHOS  --  2.5 2.0* 3.1   Liver Function Tests:  Recent Labs Lab 09/23/14 0905 09/24/14 0447 09/25/14 0510 09/26/14 0438  AST 41  --   --   --  ALT 14*  --   --   --   ALKPHOS 108  --   --   --   BILITOT 0.6  --   --   --   PROT 6.5  --   --   --   ALBUMIN 2.7* 2.5* 2.4* 2.6*    Recent Labs Lab 09/23/14 0905  LIPASE 45   No results for input(s): AMMONIA in the last 168 hours. CBC:  Recent Labs Lab 09/23/14 0905 09/24/14 0447 09/25/14 0510 09/26/14 0438  WBC 4.5 4.3 4.0 6.0  NEUTROABS 3.0  --   --   --   HGB 13.7 11.8* 12.5* 12.7*  HCT 41.5 37.0* 38.4* 38.7*  MCV 91.2 92.0 91.6 89.8  PLT 194 180 171 167    Cardiac Enzymes: No results for input(s): CKTOTAL, CKMB, CKMBINDEX, TROPONINI in the last 168 hours. BNP (last 3 results) No results for input(s): BNP in the last 8760 hours.  ProBNP (last 3 results) No results for input(s): PROBNP in the last 8760 hours.  CBG: No results for input(s): GLUCAP in the last 168 hours.  No results found for this or any previous visit (from the past 240 hour(s)).   Studies: Dg Chest 1 View  09/25/2014   CLINICAL DATA:  Dysphagia.  Esophageal stent placement  EXAM: CHEST  1 VIEW  COMPARISON:  Chest CT 09/23/2014  FINDINGS: Three intraoperative spot images from endoscopy demonstrate endoscope within the esophagus. Final images demonstrate placement of mid to distal esophageal stent, extending to the GE junction.  IMPRESSION: Placement of mid to distal esophageal stent.   Electronically Signed   By: Rolm Baptise M.D.   On: 09/25/2014 10:10   Dg Abd 1 View  09/25/2014   CLINICAL DATA:  Dysphagia with esophageal stent placement  EXAM: DG C-ARM 1-60 MIN - NRPT [facility]; ABDOMEN - 1 VIEW  COMPARISON:  None.  FLUOROSCOPY TIME:  Fluoroscopy Time:  2 minutes 6 seconds  Number of Acquired Images:  0  FINDINGS: There is an esophageal stent in the distal esophageal region. There is no bowel dilatation or air-fluid level suggesting obstruction. No free air. No abnormal calcifications.  IMPRESSION: Stent and distal esophagus extending across the gastroesophageal junction. Bowel gas pattern unremarkable.   Electronically Signed   By: Lowella Grip III M.D.   On: 09/25/2014 17:00   Dg C-arm 1-60 Min-no Report  09/25/2014   CLINICAL DATA: reflux; esophageal stent   C-ARM 1-60 MINUTES  Fluoroscopy was utilized by the requesting physician.  No radiographic  interpretation.     Scheduled Meds: . feeding supplement (RESOURCE BREEZE)  1 Container Oral TID BM  . levothyroxine  12.5 mcg Intravenous Daily  . pantoprazole (PROTONIX) IV  40 mg Intravenous BID  . polyethylene glycol  17  g Oral Daily  . sucralfate  1 g Oral TID WC & HS   Continuous Infusions: . dextrose 5 % and 0.45 % NaCl with KCl 20 mEq/L 100 mL/hr at 09/26/14 0518    Principal Problem:   Esophageal obstruction Active Problems:   Esophageal pain   Malignant mesothelioma of pleura   GERD (gastroesophageal reflux disease)   Hematemesis   Gastroesophageal reflux disease with esophagitis   Protein-calorie malnutrition, severe   Ascites   Chase Marsh  [facility] Pager [phone]. If 7PM-7AM, please contact night-coverage at [email or web address], password [facility] 09/26/2014, 3:41 PM  LOS: 3 days

## 2014-09-26 NOTE — Evaluation (Signed)
SLP Cancellation Note  Patient Details Name: Chase Marsh MRN: [id number] DOB: [birth year]   Cancelled treatment:       Reason Eval/Treat Not Completed: Other (comment);Medical issues which prohibited therapy (per RN, pt having issues with nausea/vomiting, note GI MD following re: esophageal dysphagia-stent placement)   Luanna Salk, [facility] SLP [phone]

## 2014-09-26 NOTE — Progress Notes (Signed)
[place] and [facility] -[facility]-RN Visit-Stacie Delice Lesch RN, BSN  This is a related, covered admission to [facility] diagnosis of mesothelioma. Patient is a DNR. Patient seen in room resting in bed.  Patient reported multiple episodes of vomiting up bile, since the stent placement procedure. Patient has received 3 doses of Zofran IV and 4 doses of Phenergan IV the past 24 hours, per chart review. Clear liquid tray noted at bedside is untouched.  Patient denies any pain.  Family verbalized concern of patient being discharged with medications he cannot swallow. Patient may be able to try swallowing pills with applesauce when he is able to keep food down.  Discussed the need for additional DME upon discharge.  Patient will need fully electric hospital bed with half rails, APP mattress and bedside table.  Wife will be going home today to make room for the bed and let us know when to order the equipment.  Will follow up on this need prior to discharge.  Please call [facility] with any questions or concerns.  [facility] Liaison [phone]

## 2014-09-26 NOTE — Progress Notes (Signed)
Reasonably stable course following yesterday's esoph stent placement.  Did have some n/v (?bilious), apparently non-bloody, but no chest pain.  At this time, is having occasional spit-up of secretions, but no frank vomiting.  Still feels nauseated.  Accordingly, did not take clr liq diet this a.m.  EXAM:  Lying in bed slightly inclined; NAD.  Chest has coarse breath sds -- no resp distress.  Abd min distended, tinkling BS's, nontympanitic, nontender.  KUB: from yest, no gaseous distension or other obstr findings, no fecal obstipation  IMPR:  ??post-anesth nausea  PLAN:    1.  Sucralfate suspension for possible bile reflux 2.  Continue PPI 3.  Try solid food (when ready from nausea standpoint) w/ Dietitian instruction--general guidelines given to pt and family.  Cleotis Nipper, M.D. Pager [phone] If no answer or after 5 PM call [phone]

## 2014-09-27 ENCOUNTER — Inpatient Hospital Stay (HOSPITAL_COMMUNITY)

## 2014-09-27 LAB — CBC
HEMATOCRIT: 36.9 % — AB (ref 39.0–52.0)
Hemoglobin: 11.8 g/dL — ABNORMAL LOW (ref 13.0–17.0)
MCH: 29 pg (ref 26.0–34.0)
MCHC: 32 g/dL (ref 30.0–36.0)
MCV: 90.7 fL (ref 78.0–100.0)
Platelets: 163 10*3/uL (ref 150–400)
RBC: 4.07 MIL/uL — ABNORMAL LOW (ref 4.22–5.81)
RDW: 15.2 % (ref 11.5–15.5)
WBC: 6.2 10*3/uL (ref 4.0–10.5)

## 2014-09-27 LAB — RENAL FUNCTION PANEL
ANION GAP: 5 (ref 5–15)
Albumin: 2.5 g/dL — ABNORMAL LOW (ref 3.5–5.0)
BUN: 7 mg/dL (ref 6–20)
CO2: 30 mmol/L (ref 22–32)
Calcium: 7.6 mg/dL — ABNORMAL LOW (ref 8.9–10.3)
Chloride: 100 mmol/L — ABNORMAL LOW (ref 101–111)
Creatinine, Ser: 0.63 mg/dL (ref 0.61–1.24)
GFR calc Af Amer: >60 mL/min (ref >60)
GFR calc non Af Amer: >60 mL/min (ref >60)
GLUCOSE: 129 mg/dL — AB (ref 65–99)
Phosphorus: 1.3 mg/dL — ABNORMAL LOW (ref 2.5–4.6)
Potassium: 3.8 mmol/L (ref 3.5–5.1)
Sodium: 135 mmol/L (ref 135–145)

## 2014-09-27 MED ORDER — METOCLOPRAMIDE HCL 5 MG/ML IJ SOLN
5.0000 mg | Freq: Four times a day (QID) | INTRAMUSCULAR | Status: DC
Start: 2014-09-27 — End: 2014-09-28
  Administered 2014-09-27 – 2014-09-28 (×4): 5 mg via INTRAVENOUS
  Filled 2014-09-27: qty 1
  Filled 2014-09-27: qty 2
  Filled 2014-09-27: qty 1
  Filled 2014-09-27: qty 2
  Filled 2014-09-27: qty 1
  Filled 2014-09-27: qty 2
  Filled 2014-09-27: qty 1
  Filled 2014-09-27: qty 2

## 2014-09-27 MED ORDER — BOOST PLUS PO LIQD
237.0000 mL | Freq: Three times a day (TID) | ORAL | Status: DC
Start: 2014-09-27 — End: 2014-09-30
  Administered 2014-09-28 – 2014-09-29 (×2): 237 mL via ORAL
  Filled 2014-09-27 (×10): qty 237

## 2014-09-27 NOTE — Progress Notes (Signed)
Still having nausea and occasional brown emesis.  Therefore, not eating.  What little bit he does swallow goes down ok, c/w adequate stent function.   Not yet seen by dietitian.  Declined sucralfate b/c his stomach wasn't "burning."  Encouraged to use anyway for a few days (for possible bile reflux).  Will try IV metoclopramide--advised about possible short term side effects.  Cleotis Nipper, M.D. Pager [phone] If no answer or after 5 PM call [phone]

## 2014-09-27 NOTE — Progress Notes (Signed)
Nutrition Follow-up  DOCUMENTATION CODES:  Severe malnutrition in context of chronic illness  INTERVENTION: - Continue Boost TID - RD will continue to monitor for needs  NUTRITION DIAGNOSIS:  Increased nutrient needs related to cancer and cancer related treatments as evidenced by estimated needs. -ongoing  GOAL:  Patient will meet greater than or equal to 90% of their needs -ongoing  MONITOR:  Diet advancement, Weight trends, Labs, I & O's  REASON FOR ASSESSMENT:  Consult Diet education  ASSESSMENT: 81 y.o. year-old male with history of mesothelioma of the right lung and pleura diagnosed in 2007 s/p chemotherapy, right parietal pleurectomy and XRT who presents with emesis, hematemesis, anorexia, heartburn. The patient was seen by his oncologist at [facility] on 08/21/2014 at which time he was struggling with dehydration, nausea, anorexia. He was having progression of his malignancy with new ascites on CT scan. He was referred to hospice and palliative care [facility]. He has had ongoing weight loss, anorexia, progressive weakness for the last month. Over the last several days he has had a sensation of food and liquid getting stuck in his upper chest. He has been regurgitating food and liquid despite eating a softened foods diet.   Consult received for diet education following esophageal stent placement. Pt had EGD and stent placed 7/5. Hospice note that day indicated that pt would be going home with hospice.   Pt has been nauseated and unable to keep PO down, per chart review and wife's report. Lunch tray untouched at time of visit. Spoke with wife as pt is not feeling well today and he was resting at time of visit. Talked with her about Dysphagia 1 diet and preparation of foods for this diet consistency. She indicates that pt has been on this diet in the past due to medical complications and she is appreciative of information.  Recommend advancement to Dysphagia 2 prior to d/c is this is  safe for pt and if he is able to tolerate POs; will monitor for SLP recommendations as well. Pt requesting watermelon.  Not meeting needs. Medications reviewed.Labs reviewed; Cl: 100 mmol/L, Ca: 7.6 mg/dL, Phos: 1.4 mg/dL.  Height:  Ht Readings from Last 1 Encounters:  09/25/14 5\' 11"  (1.803 m)    Weight:  Wt Readings from Last 1 Encounters:  09/25/14 143 lb 12.8 oz (65.227 kg)    Ideal Body Weight:  78.2 kg  Wt Readings from Last 10 Encounters:  09/25/14 143 lb 12.8 oz (65.227 kg)  03/31/11 170 lb (77.111 kg)    BMI:  Body mass index is 20.06 kg/(m^2).  Estimated Nutritional Needs:  Kcal:  2200-2400  Protein:  80-95 grams  Fluid:  2.5 L/day  Skin:  Reviewed, no issues  Diet Order:  DIET - DYS 1 Room service appropriate?: Yes; Fluid consistency:: Thin  EDUCATION NEEDS:  No education needs identified at this time   Intake/Output Summary (Last 24 hours) at 09/27/14 1324 Last data filed at 09/27/14 0534  Gross per 24 hour  Intake 2553.34 ml  Output    925 ml  Net 1628.34 ml    Last BM:  7/6    Jarome Matin, RD, LDN Inpatient Clinical Dietitian Pager # [phone] After hours/weekend pager # [phone]

## 2014-09-27 NOTE — Progress Notes (Signed)
[facility] PROGRESS NOTE  Chase BARBERI WNI:[id number] DOB: [birth year] DOA: 09/23/2014 PCP: Leonard Downing, MD   Brief Summary The patient is a 81 y.o. year-old male with history of mesothelioma of the right lung and pleura diagnosed in 2007 s/p chemotherapy, right parietal pleurectomy and XRT, now on hospice care, who presented with emesis, hematemesis, anorexia, heartburn. He tried to relieve the sensation of heartburn and nausea by heaving which did relieve his discomfort temporarily however he developed hematemesis. Initially had dark brownish black emesis but then he developed bright red blood, several mouthfuls.His bowels have been normal in frequency, consistency, and color but now he feels constipated with lower abdominal fullness. CT scan of the chest demonstrated significant mass effect upon the distal esophagus by pleural-based masses causing partial obstruction. Gastroenterology and radiation oncology were consulted. He was made nothing by mouth and started on IV fluids pending further evaluation and decision-making regarding nutrition.  Assessment/Plan: 1. Esophageal obstruction secondary to progressive malignancy 1. Pt is followed by GI 2. Pt is s/p stenting by Dr. Watt Climes on 7/5 3. Remains on dysphagia 1 diet today, however pt unable to tolerate this AM 4. Cont supportive care 5. Trial of reglan was started by GI 6. Decreased BS on exam. Will order abd xray to r/o ileus/bowel obstruction 2. BHP 1. Stable 3. Irregular heart rhythm 1. PAC's noted on tele recently this admit, not in afib 4. Hypokalemia 1. Replaced, normalized 5. Metastatic mesothelioma 1. Under hospice care 6. Severe protein calorie malnutrition 1. Nutrition seen in consultation 7. DVT prophylaxis 1. SCD's  Code Status: DNR Family Communication: Pt in room, family at bedside Disposition Plan: Home when able to tolerate PO   Consultants:  GI  Procedures:  Esophageal stenting  7/5  Antibiotics:    HPI/Subjective: Complains of feeling worse today.   Objective: Filed Vitals:   09/26/14 0509 09/26/14 1406 09/26/14 2145 09/27/14 0504  BP: 98/65 107/70 104/66 95/48  Pulse: 86 81 89 90  Temp: 98.1 F (36.7 C) 98.2 F (36.8 C) 98.4 F (36.9 C) 98.3 F (36.8 C)  TempSrc: Oral Oral Oral Oral  Resp: 20 18 18 16   Height:      Weight:      SpO2: 91% 93% 93% 91%    Intake/Output Summary (Last 24 hours) at 09/27/14 1318 Last data filed at 09/27/14 0534  Gross per 24 hour  Intake 2553.34 ml  Output    925 ml  Net 1628.34 ml   Filed Weights   09/25/14 0739  Weight: 65.227 kg (143 lb 12.8 oz)    Exam:   General:  Awake, in nad  Cardiovascular: regular, s1,s2  Respiratory: normal resp effort, no wheezing  Abdomen: decreased BS, mildly distended  Musculoskeletal: perfused, no clubbing   Data Reviewed: Basic Metabolic Panel:  Recent Labs Lab 09/23/14 0905 09/24/14 0447 09/25/14 0510 09/26/14 0438 09/27/14 0505  NA 138 139 139 135 135  K 3.3* 3.5 4.2 4.0 3.8  CL 99* 104 103 102 100*  CO2 29 27 28 24 30   GLUCOSE 102* 85 87 174* 129*  BUN 8 7 6 8 7   CREATININE 0.68 0.73 0.71 0.67 0.63  CALCIUM 8.2* 7.5* 7.8* 7.5* 7.6*  MG  --   --  1.1*  --   --   PHOS  --  2.5 2.0* 3.1 1.3*   Liver Function Tests:  Recent Labs Lab 09/23/14 0905 09/24/14 0447 09/25/14 0510 09/26/14 0438 09/27/14 0505  AST 41  --   --   --   --  ALT 14*  --   --   --   --   ALKPHOS 108  --   --   --   --   BILITOT 0.6  --   --   --   --   PROT 6.5  --   --   --   --   ALBUMIN 2.7* 2.5* 2.4* 2.6* 2.5*    Recent Labs Lab 09/23/14 0905  LIPASE 45   No results for input(s): AMMONIA in the last 168 hours. CBC:  Recent Labs Lab 09/23/14 0905 09/24/14 0447 09/25/14 0510 09/26/14 0438 09/27/14 0505  WBC 4.5 4.3 4.0 6.0 6.2  NEUTROABS 3.0  --   --   --   --   HGB 13.7 11.8* 12.5* 12.7* 11.8*  HCT 41.5 37.0* 38.4* 38.7* 36.9*  MCV 91.2 92.0 91.6  89.8 90.7  PLT 194 180 171 167 163   Cardiac Enzymes: No results for input(s): CKTOTAL, CKMB, CKMBINDEX, TROPONINI in the last 168 hours. BNP (last 3 results) No results for input(s): BNP in the last 8760 hours.  ProBNP (last 3 results) No results for input(s): PROBNP in the last 8760 hours.  CBG: No results for input(s): GLUCAP in the last 168 hours.  No results found for this or any previous visit (from the past 240 hour(s)).   Studies: Dg Abd 1 View  09/25/2014   CLINICAL DATA:  Dysphagia with esophageal stent placement  EXAM: DG C-ARM 1-60 MIN - NRPT [facility]; ABDOMEN - 1 VIEW  COMPARISON:  None.  FLUOROSCOPY TIME:  Fluoroscopy Time:  2 minutes 6 seconds  Number of Acquired Images:  0  FINDINGS: There is an esophageal stent in the distal esophageal region. There is no bowel dilatation or air-fluid level suggesting obstruction. No free air. No abnormal calcifications.  IMPRESSION: Stent and distal esophagus extending across the gastroesophageal junction. Bowel gas pattern unremarkable.   Electronically Signed   By: Lowella Grip III M.D.   On: 09/25/2014 17:00    Scheduled Meds: . feeding supplement (RESOURCE BREEZE)  1 Container Oral TID BM  . levothyroxine  12.5 mcg Intravenous Daily  . metoCLOPramide (REGLAN) injection  5 mg Intravenous 4 times per day  . pantoprazole (PROTONIX) IV  40 mg Intravenous BID  . polyethylene glycol  17 g Oral Daily  . sucralfate  1 g Oral TID WC & HS   Continuous Infusions: . dextrose 5 % and 0.45 % NaCl with KCl 20 mEq/L 100 mL/hr at 09/27/14 1233    Principal Problem:   Esophageal obstruction Active Problems:   Esophageal pain   Malignant mesothelioma of pleura   GERD (gastroesophageal reflux disease)   Hematemesis   Gastroesophageal reflux disease with esophagitis   Protein-calorie malnutrition, severe   Ascites   Charlee Squibb K  [facility] Pager [phone]. If 7PM-7AM, please contact night-coverage at [email or web address], password  [facility] 09/27/2014, 1:18 PM  LOS: 4 days

## 2014-09-27 NOTE — Evaluation (Signed)
Clinical/Bedside Swallow Evaluation Patient Details  Name: Chase Marsh MRN: [id number] Date of Birth: December 03, 1931  Today's Date: 09/27/2014 Time: SLP Start Time (ACUTE ONLY): 1450 SLP Stop Time (ACUTE ONLY): 1515 SLP Time Calculation (min) (ACUTE ONLY): 25 min  Past Medical History:  Past Medical History  Diagnosis Date  . Bladder outlet obstruction   . BPH (benign prostatic hypertrophy) with urinary obstruction   . Hyperlipidemia   . History of lung cancer RIGHT LOWER LOBE- MESOTHELIOMA---  2007  . Frequent urination   . Urgency of urination   . Nocturia   . Shortness of breath on exertion   . Patient able to exercise   . Malignant mesothelioma of pleura ONCOLOGIST-  DR Sharlet Salina AT [facility]-----  DX  JULY 2007--  S/P CHEMORADIATION , LUNG SURG. RIGHT LOWER LUBE    RECURRENCE 2010 W/ CHEMO THERAPY  . Kidney stone   . Pneumothorax     1968  . Chronic headaches   . GERD (gastroesophageal reflux disease)    Past Surgical History:  Past Surgical History  Procedure Laterality Date  . Transurethral resection of prostate  SEPT  2008  . Knee arthroscopy  1994    RIGHT  . Right vat/ pleural bx/ diaphragmatic bx/ drainage pleural effusion  09-21-2005  . Thoracentesis  09-18-2005    RIGHT LOWER LOBE EFFUSION  . Right lung lining removed  NOV  2007  . Transurethral resection of prostate  04/02/2011    Procedure: TRANSURETHRAL RESECTION OF THE PROSTATE WITH GYRUS INSTRUMENTS;  Surgeon: Ailene Rud, MD;  Location: [facility];  Service: Urology;  Laterality: Left;  GYRUS    . Esophagogastroduodenoscopy (egd) with propofol N/A 09/25/2014    Procedure: ESOPHAGOGASTRODUODENOSCOPY (EGD) WITH PROPOFOL;  Surgeon: Clarene Essex, MD;  Location: [facility];  Service: Endoscopy;  Laterality: N/A;  . Esophageal stent placement N/A 09/25/2014    Procedure: ESOPHAGEAL STENT PLACEMENT;  Surgeon: Clarene Essex, MD;  Location: [facility];  Service: Endoscopy;  Laterality: N/A;   HPI:   81 yo male referred for BSE.  Pt with esophageal obstruction s/p stent placement.  He reports difficulties with nausea and vomiting bile (green colored) and secretions that are same color as food/drink consumed.   Pt receiving Reglan and carafate.  The patient is a 81 y.o. year-old male with history of mesothelioma of the right lung and pleura diagnosed in 2007 s/p chemotherapy, right parietal pleurectomy and XRT, now on hospice care, who presented with emesis, hematemesis, anorexia, heartburn. CT scan of the chest demonstrated significant mass effect upon the distal esophagus by pleural-based masses causing partial obstruction.   Assessment / Plan / Recommendation Clinical Impression  Pt presents with no overt oropharyngeal deficits.  He reports largest barrier to po is his nausea/vomiting - not dysphagia.    Lunch tray at bedside of which pt consumed a small amount of icecream and mashed potatoes.  Spouse reports pt brought up secretions that were same color as intake in addition to bile colored.  Pt also reportedly had Boost Breeze "come back up" after intake.  Per pt, he is adequately protecting his airway of emesis/regurgitation.    SLP advised pt to speak to pharmacist/RN re: possible po medications that may be provided in liquid form or crushable.  Pt reports all medications are IV at this time.    CN exam unremarkable and pt observed only consuming small amount of liquid with adequate tolerance and no sensation of residuals/stasis nor s/s of aspiration.  Reviewed mitigation strategies with pt and family.  Unfortunately, as pt primary deficit is GI related, SLP has nothing further to offer for this pt.    Appreciate GI and primary MD's efforts in helping pt with dysphagia symptoms.  Thanks for this referral.     Aspiration Risk  Moderate    Diet Recommendation Thin (defer to GI Md re: solid food consumption but advised pt to masticate VERY thoroughly if diet advanced due to esophageal  stent placement at [facility])   Medication Administration: Via alternative means (consider liquid or crushed ) Compensations: Slow rate;Small sips/bites (frequent rest breaks, small amounts at a time)    Other  Recommendations Oral Care Recommendations: Oral care BID   Follow Up Recommendations       Frequency and Duration        Pertinent Vitals/Pain Afebrile, decreased      Swallow Study Prior Functional Status   see [place] Date of Onset: 09/27/14 Other Pertinent Information: 81 yo male referred for BSE.  Pt with esophageal obstruction s/p stent placement.  He reports difficulties with nausea and vomiting bile (green colored) and secretions that are same color as food/drink consumed.   Pt receiving Reglan and carafate.  The patient is a 81 y.o. year-old male with history of mesothelioma of the right lung and pleura diagnosed in 2007 s/p chemotherapy, right parietal pleurectomy and XRT, now on hospice care, who presented with emesis, hematemesis, anorexia, heartburn. CT scan of the chest demonstrated significant mass effect upon the distal esophagus by pleural-based masses causing partial obstruction. Type of Study: Bedside swallow evaluation Previous Swallow Assessment: esophagram 09/2010 09/2010 Initially a double contrast barium swallow was conducted.  The mucosa of the esophagus is unremarkable. Swallow mechanism normal.  There are mild tertiary contractions in the mid and distal  esophagus. A small hiatal hernia is noted which has increased in size since 08 exam. Moderate reflux.   Diet Prior to this Study: Dysphagia 1 (puree);Thin liquids Temperature Spikes Noted: No Respiratory Status: Room air History of Recent Intubation: No Behavior/Cognition: Alert;Cooperative;Other (Comment) (flat affect) Oral Cavity - Dentition: Adequate natural dentition/normal for age Self-Feeding Abilities: Able to feed self Patient Positioning: Upright in bed Baseline Vocal Quality:  Normal Volitional Cough: Other (Comment) (DNT, family reports cough can elicit expectoration) Volitional Swallow: Able to elicit    Oral/Motor/Sensory Function Overall Oral Motor/Sensory Function: Appears within functional limits for tasks assessed   Ice Chips Ice chips: Not tested   Thin Liquid Thin Liquid: Within functional limits Presentation: Self Fed    Nectar Thick Nectar Thick Liquid: Not tested   Honey Thick Honey Thick Liquid: Not tested   Puree Puree: Not tested Other Comments: pt had recently consumed a few boluses of icecream and mashed potatoes with regurgitation per his statement   Solid   GO    Solid: Not tested      Luanna Salk, [place] SLP [phone]

## 2014-09-27 NOTE — Progress Notes (Signed)
[place] and [facility] -[facility]-RN Visit-Stacie Delice Lesch RN, BSN  This is a related, covered admission to [facility] diagnosis of mesothelioma. Patient is a DNR. Patient seen in room resting in bed. Patient reported the nausea is still present, however not as intense as yesterday.  He stated his last dose of Zofran was yesterday evening.  He reports only taking a few bites of grits and eggs this morning.  He is still unable to swallow pills at this time.  He denies any pain.  Followed-up with patient regarding the need for additional DME upon discharge. Patient decided to not get the hospital bed at this time.  Currently working on ordering a wedge for the patient to use in his home, so he stays at minimum a 45 degree angle.  Please call [facility] with any questions or concerns.  [place] [facility] Liaison [phone]

## 2014-09-27 NOTE — Progress Notes (Signed)
Patient's family reported third loose stool in a 24 hour period.  Per protocol, contact precaution initiated.  Patient's last stool was black according to family.

## 2014-09-28 LAB — RENAL FUNCTION PANEL
ALBUMIN: 2.3 g/dL — AB (ref 3.5–5.0)
ANION GAP: 5 (ref 5–15)
BUN: 6 mg/dL (ref 6–20)
CHLORIDE: 100 mmol/L — AB (ref 101–111)
CO2: 29 mmol/L (ref 22–32)
Calcium: 7.5 mg/dL — ABNORMAL LOW (ref 8.9–10.3)
Creatinine, Ser: 0.49 mg/dL — ABNORMAL LOW (ref 0.61–1.24)
Glucose, Bld: 111 mg/dL — ABNORMAL HIGH (ref 65–99)
POTASSIUM: 3.4 mmol/L — AB (ref 3.5–5.1)
Phosphorus: 1.3 mg/dL — ABNORMAL LOW (ref 2.5–4.6)
SODIUM: 134 mmol/L — AB (ref 135–145)

## 2014-09-28 LAB — CBC
HCT: 34.9 % — ABNORMAL LOW (ref 39.0–52.0)
Hemoglobin: 11.4 g/dL — ABNORMAL LOW (ref 13.0–17.0)
MCH: 29.6 pg (ref 26.0–34.0)
MCHC: 32.7 g/dL (ref 30.0–36.0)
MCV: 90.6 fL (ref 78.0–100.0)
PLATELETS: 142 10*3/uL — AB (ref 150–400)
RBC: 3.85 MIL/uL — ABNORMAL LOW (ref 4.22–5.81)
RDW: 15.2 % (ref 11.5–15.5)
WBC: 4.9 10*3/uL (ref 4.0–10.5)

## 2014-09-28 MED ORDER — SODIUM CHLORIDE 0.9 % IV SOLN
8.0000 mg | Freq: Three times a day (TID) | INTRAVENOUS | Status: DC
  Administered 2014-09-28 – 2014-09-30 (×5): 8 mg via INTRAVENOUS
  Filled 2014-09-28 (×7): qty 4

## 2014-09-28 MED ORDER — DEXAMETHASONE SODIUM PHOSPHATE 4 MG/ML IJ SOLN
2.0000 mg | Freq: Two times a day (BID) | INTRAMUSCULAR | Status: DC
  Administered 2014-09-28 – 2014-09-30 (×4): 2 mg via INTRAVENOUS
  Filled 2014-09-28 (×5): qty 0.5

## 2014-09-28 MED ORDER — METOCLOPRAMIDE HCL 5 MG/ML IJ SOLN
10.0000 mg | Freq: Four times a day (QID) | INTRAMUSCULAR | Status: DC
  Administered 2014-09-28 – 2014-09-30 (×8): 10 mg via INTRAVENOUS
  Filled 2014-09-28 (×9): qty 2

## 2014-09-28 MED ORDER — FENTANYL CITRATE (PF) 100 MCG/2ML IJ SOLN: 12.5000 ug | INTRAMUSCULAR | Status: DC | PRN

## 2014-09-28 MED ORDER — FAMOTIDINE IN NACL 20-0.9 MG/50ML-% IV SOLN
20.0000 mg | Freq: Two times a day (BID) | INTRAVENOUS | Status: DC
  Administered 2014-09-28 – 2014-09-29 (×2): 20 mg via INTRAVENOUS
  Filled 2014-09-28 (×2): qty 50

## 2014-09-28 MED ORDER — LORAZEPAM 2 MG/ML IJ SOLN: 0.2500 mg | INTRAMUSCULAR | Status: DC | PRN

## 2014-09-28 MED ORDER — SCOPOLAMINE 1 MG/3DAYS TD PT72
1.0000 | MEDICATED_PATCH | TRANSDERMAL | Status: DC
  Administered 2014-09-28: 1.5 mg via TRANSDERMAL
  Filled 2014-09-28: qty 1

## 2014-09-28 NOTE — Progress Notes (Signed)
[name] [facility] [place]-RN Visit-Stacie Delice Lesch RN, BSN  This is a related, covered admission to [facility] diagnosis of mesothelioma. Patient is a DNR. Patient seen in room resting in bed. His wife Chase Marsh and sons are at bedside.  Family is concerned the patient is still unable to swallow and they report that the nausea medication has been ineffective.  His breakfast is at bedside untouched.  He has a audible congested cough and rhoncii noted in bilateral upper lobes.  Bowel sounds present.  He denies any pain.  Family concerned that his symptoms are not improving at this point and he is still unable to swallow and keep any food down.  Offered emotional support to the family. At this time, they are still hopeful to take him home if his symptoms can improve. MD made aware of patient and family concerns and PMT to be consulted.  Updated [facility] social worker, Chase Marsh about status of patient.  Please call [facility] with any questions or concerns.  [place] [facility] Liaison [phone]

## 2014-09-28 NOTE — Progress Notes (Signed)
[facility] PROGRESS NOTE  Chase Marsh:[id number] DOB: February 27, 1932 DOA: 09/23/2014 PCP: Chase Downing, MD   Brief Summary The patient is a 81 y.o. year-old male with history of mesothelioma of the right lung and pleura diagnosed in 2007 s/p chemotherapy, right parietal pleurectomy and XRT, now on hospice care, who presented with emesis, hematemesis, anorexia, heartburn. He tried to relieve the sensation of heartburn and nausea by heaving which did relieve his discomfort temporarily however he developed hematemesis. Initially had dark brownish black emesis but then he developed bright red blood, several mouthfuls.His bowels have been normal in frequency, consistency, and color but now he feels constipated with lower abdominal fullness. CT scan of the chest demonstrated significant mass effect upon the distal esophagus by pleural-based masses causing partial obstruction. Gastroenterology and radiation oncology were consulted. He was made nothing by mouth and started on IV fluids pending further evaluation and decision-making regarding nutrition.  Assessment/Plan: 1. Esophageal obstruction secondary to progressive malignancy 1. Pt is followed by GI 2. Pt is s/p stenting by Dr. Watt Marsh on 7/5 3. Remains on dysphagia 1 diet today, however pt continues to have difficulty tolerating diet 4. Cont supportive care 5. Trial of reglan was started by GI, recs to increase dose to 10mg  6. No obstruction on abd xray 7. Have consulted Palliative Care for assistance with symptom management 2. BHP 1. Stable 3. Irregular heart rhythm 1. PAC's were noted on tele recently this admit, not in afib 4. Hypokalemia 1. Replaced, normalized 5. Metastatic mesothelioma 1. Under hospice care 6. Severe protein calorie malnutrition 1. Nutrition has seen in consultation 7. DVT prophylaxis 1. SCD's 8. Diarrhea 1. Likely secondary to reglan 2. Afebrile, no leukocytosis 3. Have removed contact  precautions  Code Status: DNR Family Communication: Pt in room, family at bedside Disposition Plan: Home when able to tolerate PO  Consultants:  GI  Procedures:  Esophageal stenting 7/5  Antibiotics:    HPI/Subjective: Still feels nauseated   Objective: Filed Vitals:   09/27/14 2044 09/28/14 0613 09/28/14 0700 09/28/14 1504  BP: 107/75 97/61  97/63  Pulse: 109 90 93 62  Temp: 98.9 F (37.2 C) 98.3 F (36.8 C)  98.2 F (36.8 C)  TempSrc: Oral Oral  Oral  Resp: 22 22  21   Height:      Weight:      SpO2: 90% 91%  93%    Intake/Output Summary (Last 24 hours) at 09/28/14 1508 Last data filed at 09/28/14 1300  Gross per 24 hour  Intake 2483.33 ml  Output    875 ml  Net 1608.33 ml   Filed Weights   09/25/14 0739  Weight: 65.227 kg (143 lb 12.8 oz)    Exam:   General:  Awake, in nad  Cardiovascular: regular, s1,s2  Respiratory: normal resp effort, no wheezing  Abdomen: decreased BS, generally tender  Musculoskeletal: perfused, no clubbing   Data Reviewed: Basic Metabolic Panel:  Recent Labs Lab 09/24/14 0447 09/25/14 0510 09/26/14 0438 09/27/14 0505 09/28/14 0510  NA 139 139 135 135 134*  Marsh 3.5 4.2 4.0 3.8 3.4*  CL 104 103 102 100* 100*  CO2 27 28 24 30 29   GLUCOSE 85 87 174* 129* 111*  BUN 7 6 8 7 6   CREATININE 0.73 0.71 0.67 0.63 0.49*  CALCIUM 7.5* 7.8* 7.5* 7.6* 7.5*  MG  --  1.1*  --   --   --   PHOS 2.5 2.0* 3.1 1.3* 1.3*   Liver Function Tests:  Recent  Labs Lab 09/23/14 0905 09/24/14 0447 09/25/14 0510 09/26/14 0438 09/27/14 0505 09/28/14 0510  AST 41  --   --   --   --   --   ALT 14*  --   --   --   --   --   ALKPHOS 108  --   --   --   --   --   BILITOT 0.6  --   --   --   --   --   PROT 6.5  --   --   --   --   --   ALBUMIN 2.7* 2.5* 2.4* 2.6* 2.5* 2.3*    Recent Labs Lab 09/23/14 0905  LIPASE 45   No results for input(s): AMMONIA in the last 168 hours. CBC:  Recent Labs Lab 09/23/14 0905 09/24/14 0447  09/25/14 0510 09/26/14 0438 09/27/14 0505 09/28/14 0510  WBC 4.5 4.3 4.0 6.0 6.2 4.9  NEUTROABS 3.0  --   --   --   --   --   HGB 13.7 11.8* 12.5* 12.7* 11.8* 11.4*  HCT 41.5 37.0* 38.4* 38.7* 36.9* 34.9*  MCV 91.2 92.0 91.6 89.8 90.7 90.6  PLT 194 180 171 167 163 142*   Cardiac Enzymes: No results for input(s): CKTOTAL, CKMB, CKMBINDEX, TROPONINI in the last 168 hours. BNP (last 3 results) No results for input(s): BNP in the last 8760 hours.  ProBNP (last 3 results) No results for input(s): PROBNP in the last 8760 hours.  CBG: No results for input(s): GLUCAP in the last 168 hours.  No results found for this or any previous visit (from the past 240 hour(s)).   Studies: Dg Abd Portable 1v  09/27/2014   CLINICAL DATA:  Ileus. Malignant mesothelioma and esophageal obstruction.  EXAM: PORTABLE ABDOMEN - 1 VIEW  COMPARISON:  None.  FINDINGS: A stent is again seen across the distal esophagus. No evidence of dilated bowel loops. No other significant radiographic abnormality identified.  IMPRESSION: Normal bowel gas pattern.  Distal esophageal stent again seen.   Electronically Signed   By: Chase Marsh M.D.   On: 09/27/2014 13:57    Scheduled Meds: . lactose free nutrition  237 mL Oral TID WC  . levothyroxine  12.5 mcg Intravenous Daily  . metoCLOPramide (REGLAN) injection  10 mg Intravenous 4 times per day  . pantoprazole (PROTONIX) IV  40 mg Intravenous BID  . polyethylene glycol  17 g Oral Daily  . sucralfate  1 g Oral TID WC & HS   Continuous Infusions: . dextrose 5 % and 0.45 % NaCl with KCl 20 mEq/L 100 mL/hr at 09/28/14 0621    Principal Problem:   Esophageal obstruction Active Problems:   Esophageal pain   Malignant mesothelioma of pleura   GERD (gastroesophageal reflux disease)   Hematemesis   Gastroesophageal reflux disease with esophagitis   Protein-calorie malnutrition, severe   Ascites   Chase Marsh  [facility] Pager [phone]. If 7PM-7AM, please  contact night-coverage at [email or web address], password [facility] 09/28/2014, 3:08 PM  LOS: 5 days

## 2014-09-28 NOTE — Progress Notes (Signed)
[facility] Progress Note  Subjective: Patient still complaining of continuous nausea regurgitating bilious material. No obvious side effects from low dose Reglan  Objective: Vital signs in last 24 hours: Temp:  [98.3 F (36.8 C)-98.9 F (37.2 C)] 98.3 F (36.8 C) (07/08 3536) Pulse Rate:  [90-109] 93 (07/08 0700) Resp:  [18-22] 22 (07/08 0613) BP: (97-107)/(61-75) 97/61 mmHg (07/08 0613) SpO2:  [90 %-91 %] 91 % (07/08 0613) Weight change:    PE: Unchanged looks mildly uncomfortable  Lab Results: Results for orders placed or performed during the hospital encounter of 09/23/14 (from the past 24 hour(s))  CBC     Status: Abnormal   Collection Time: 09/28/14  5:10 AM  Result Value Ref Range   WBC 4.9 4.0 - 10.5 K/uL   RBC 3.85 (L) 4.22 - 5.81 MIL/uL   Hemoglobin 11.4 (L) 13.0 - 17.0 g/dL   HCT 34.9 (L) 39.0 - 52.0 %   MCV 90.6 78.0 - 100.0 fL   MCH 29.6 26.0 - 34.0 pg   MCHC 32.7 30.0 - 36.0 g/dL   RDW 15.2 11.5 - 15.5 %   Platelets 142 (L) 150 - 400 K/uL  Renal function panel     Status: Abnormal   Collection Time: 09/28/14  5:10 AM  Result Value Ref Range   Sodium 134 (L) 135 - 145 mmol/L   Potassium 3.4 (L) 3.5 - 5.1 mmol/L   Chloride 100 (L) 101 - 111 mmol/L   CO2 29 22 - 32 mmol/L   Glucose, Bld 111 (H) 65 - 99 mg/dL   BUN 6 6 - 20 mg/dL   Creatinine, Ser 0.49 (L) 0.61 - 1.24 mg/dL   Calcium 7.5 (L) 8.9 - 10.3 mg/dL   Phosphorus 1.3 (L) 2.5 - 4.6 mg/dL   Albumin 2.3 (L) 3.5 - 5.0 g/dL   GFR calc non Af Amer >60 >60 mL/min   GFR calc Af Amer >60 >60 mL/min   Anion gap 5 5 - 15    Studies/Results: Dg Abd Portable 1v  09/27/2014   CLINICAL DATA:  Ileus. Malignant mesothelioma and esophageal obstruction.  EXAM: PORTABLE ABDOMEN - 1 VIEW  COMPARISON:  None.  FINDINGS: A stent is again seen across the distal esophagus. No evidence of dilated bowel loops. No other significant radiographic abnormality identified.  IMPRESSION: Normal bowel gas pattern.  Distal  esophageal stent again seen.   Electronically Signed   By: Earle Gell M.D.   On: 09/27/2014 13:57      Assessment: Persistent nausea and vomiting status post recent esophageal stent placement which appears to be in good position by yesterday's KUB. No response to low-dose Reglan.  Plan: Increase Reglan to 10 mg 4 times a day and hope that this brings some improvement.    Ireene Ballowe C 09/28/2014, 1:53 PM  Pager [phone] If no answer or after 5 PM call [phone]

## 2014-09-28 NOTE — Consult Note (Signed)
Palliative Medicine Team Consult  Consulted for refractory nausea in man with espohageal stent and mesothelioma [facility] patient.  Refractory nausea due to upper gut dysmotility: Cholinergic, Histaminic, 5HT3, 5HT4 nausea targets need to be covered -Continue Reglan scheduled as ordered. - Started scop patch- anticholinergic -dexamethasone-low dose for nausea -Famotidine-H2 blocker  Needs scheduled medication-can use Promethazine or benedryl for breakthrough.  For sleep I recommend a low dose short acting benzodiazapine. Ativan 0.25mg  IV prn sleep- watch for signs of agitation or paradoxical effect.  Will also add on fentanyl low dose for pain prn.  Following closely.  Lane Hacker, DO Palliative Medicine

## 2014-09-29 LAB — RENAL FUNCTION PANEL
ALBUMIN: 2.4 g/dL — AB (ref 3.5–5.0)
Anion gap: 7 (ref 5–15)
BUN: 6 mg/dL (ref 6–20)
CHLORIDE: 99 mmol/L — AB (ref 101–111)
CO2: 28 mmol/L (ref 22–32)
CREATININE: 0.53 mg/dL — AB (ref 0.61–1.24)
Calcium: 7.5 mg/dL — ABNORMAL LOW (ref 8.9–10.3)
GFR calc Af Amer: >60 mL/min (ref >60)
Glucose, Bld: 122 mg/dL — ABNORMAL HIGH (ref 65–99)
Phosphorus: 2.4 mg/dL — ABNORMAL LOW (ref 2.5–4.6)
Potassium: 3.7 mmol/L (ref 3.5–5.1)
Sodium: 134 mmol/L — ABNORMAL LOW (ref 135–145)

## 2014-09-29 LAB — CBC
HCT: 37.4 % — ABNORMAL LOW (ref 39.0–52.0)
Hemoglobin: 12.2 g/dL — ABNORMAL LOW (ref 13.0–17.0)
MCH: 29.8 pg (ref 26.0–34.0)
MCHC: 32.6 g/dL (ref 30.0–36.0)
MCV: 91.2 fL (ref 78.0–100.0)
PLATELETS: 148 10*3/uL — AB (ref 150–400)
RBC: 4.1 MIL/uL — AB (ref 4.22–5.81)
RDW: 15.3 % (ref 11.5–15.5)
WBC: 5.2 10*3/uL (ref 4.0–10.5)

## 2014-09-29 MED ORDER — HYDROCOD POLST-CPM POLST ER 10-8 MG/5ML PO SUER
5.0000 mL | Freq: Two times a day (BID) | ORAL | Status: DC
  Administered 2014-09-29 – 2014-09-30 (×3): 5 mL via ORAL
  Filled 2014-09-29 (×3): qty 5

## 2014-09-29 MED ORDER — RANITIDINE HCL 150 MG/10ML PO SYRP
150.0000 mg | ORAL_SOLUTION | Freq: Two times a day (BID) | ORAL | Status: DC
  Administered 2014-09-29 – 2014-09-30 (×2): 150 mg via ORAL
  Filled 2014-09-29 (×3): qty 10

## 2014-09-29 MED ORDER — IPRATROPIUM-ALBUTEROL 0.5-2.5 (3) MG/3ML IN SOLN: 3.0000 mL | Freq: Four times a day (QID) | RESPIRATORY_TRACT | Status: DC

## 2014-09-29 MED ORDER — IPRATROPIUM-ALBUTEROL 0.5-2.5 (3) MG/3ML IN SOLN: 3.0000 mL | RESPIRATORY_TRACT | Status: DC | PRN

## 2014-09-29 MED ORDER — BENZONATATE 100 MG PO CAPS
100.0000 mg | ORAL_CAPSULE | Freq: Three times a day (TID) | ORAL | Status: DC
  Administered 2014-09-29 – 2014-09-30 (×4): 100 mg via ORAL
  Filled 2014-09-29 (×6): qty 1

## 2014-09-29 NOTE — Progress Notes (Signed)
Patient ID: Chase Marsh, male   DOB: [birth year], 81 y.o.   MRN: [id number] [facility] Progress Note  MAXIMINO COZZOLINO 81 y.o. 10-Mar-1932   Subjective: Able to eat a small amount of breakfast and denies N/V/abd pain with eating. Wife and son at bedside. Looking forward to going home tomorrow.  Objective: Vital signs in last 24 hours: Filed Vitals:   09/29/14 0528  BP: 99/69  Pulse: 86  Temp: 98 F (36.7 C)  Resp: 20    Physical Exam: Gen: alert, no acute distress, thin, elderly CV: RRR Chest: CTA B Abd: diffusely tender with minimal guarding, soft, mild distention, +BS Ext: no edema  Lab Results:  Recent Labs  09/28/14 0510 09/29/14 0521  NA 134* 134*  K 3.4* 3.7  CL 100* 99*  CO2 29 28  GLUCOSE 111* 122*  BUN 6 6  CREATININE 0.49* 0.53*  CALCIUM 7.5* 7.5*  PHOS 1.3* 2.4*    Recent Labs  09/28/14 0510 09/29/14 0521  ALBUMIN 2.3* 2.4*    Recent Labs  09/28/14 0510 09/29/14 0521  WBC 4.9 5.2  HGB 11.4* 12.2*  HCT 34.9* 37.4*  MCV 90.6 91.2  PLT 142* 148*   No results for input(s): LABPROT, INR in the last 72 hours.  history of mesothelioma of the right lung and pleura diagnosed in 2007 s/p chemotherapy, right parietal pleurectomy and XRT, now on hospice care, who presented with emesis, hematemesis, anorexia, heartburn. He tried   Assessment/Plan: S/P Esophageal stent on 09/25/14 for esophageal obstruction likely from malignancy with history of mesothelioma of the right lung and pleura. Hopefully will continue to tolerate POs without N/V. Needs to be on a PPI BID at discharge. Continue supportive care. Keep head of bed upright during PO ingestion. Will sign off. F/U with GI prn.   Clovis C. 09/29/2014, 11:57 AM  Pager [phone]  If no answer or after 5 PM call [phone]

## 2014-09-29 NOTE — Progress Notes (Signed)
[facility] PROGRESS NOTE  ROSHUN KLINGENSMITH KVQ:[id number] DOB: Sep 09, 1931 DOA: 09/23/2014 PCP: Leonard Downing, MD   Brief Summary The patient is a 81 y.o. year-old male with history of mesothelioma of the right lung and pleura diagnosed in 2007 s/p chemotherapy, right parietal pleurectomy and XRT, now on hospice care, who presented with emesis, hematemesis, anorexia, heartburn. He tried to relieve the sensation of heartburn and nausea by heaving which did relieve his discomfort temporarily however he developed hematemesis. Initially had dark brownish black emesis but then he developed bright red blood, several mouthfuls.His bowels have been normal in frequency, consistency, and color but now he feels constipated with lower abdominal fullness. CT scan of the chest demonstrated significant mass effect upon the distal esophagus by pleural-based masses causing partial obstruction. Gastroenterology and radiation oncology were consulted. He was made nothing by mouth and started on IV fluids pending further evaluation and decision-making regarding nutrition.  Assessment/Plan: 1. Esophageal obstruction secondary to progressive malignancy 1. Pt is followed by GI 2. Pt is s/p stenting by Dr. Watt Climes on 7/5 3. Palliative consulted yesterday, appreciate input 4. Patient able to eat this morning 5. Cont supportive care 2. BHP 1. Stable 3. Irregular heart rhythm 1. PAC's were noted on tele recently this admit, not in afib 4. Hypokalemia 1. Replaced, normalized 5. Metastatic mesothelioma 1. Under hospice care 6. Severe protein calorie malnutrition 1. Nutrition has seen in consultation 7. DVT prophylaxis 1. SCD's 8. Diarrhea 1. Likely secondary to reglan 2. Afebrile, no leukocytosis 3. Have removed contact precautions  Code Status: DNR Family Communication: Pt in room, family at bedside Disposition Plan: home 24h if continues to tolerate po intake  Consultants:  GI  Palliative    Procedures:  Esophageal stenting 7/5  Antibiotics:    HPI/Subjective: Feeling much better, able to eat breakfast this morning, - no chest pain, shortness of breath, no abdominal pain, nausea or vomiting.    Objective: Filed Vitals:   09/28/14 0700 09/28/14 1504 09/28/14 2124 09/29/14 0528  BP:  97/63 110/71 99/69  Pulse: 93 62 92 86  Temp:  98.2 F (36.8 C) 98.4 F (36.9 C) 98 F (36.7 C)  TempSrc:  Oral Oral Oral  Resp:  21 22 20   Height:      Weight:      SpO2:  93% 93% 94%    Intake/Output Summary (Last 24 hours) at 09/29/14 0858 Last data filed at 09/29/14 0810  Gross per 24 hour  Intake   1669 ml  Output      0 ml  Net   1669 ml   Filed Weights   09/25/14 0739  Weight: 65.227 kg (143 lb 12.8 oz)    Exam:   General:  Awake, in nad  Cardiovascular: regular, s1,s2  Respiratory: normal resp effort, no wheezing  Abdomen: decreased BS, generally tender  Musculoskeletal: perfused, no clubbing   Data Reviewed: Basic Metabolic Panel:  Recent Labs Lab 09/25/14 0510 09/26/14 0438 09/27/14 0505 09/28/14 0510 09/29/14 0521  NA 139 135 135 134* 134*  K 4.2 4.0 3.8 3.4* 3.7  CL 103 102 100* 100* 99*  CO2 28 24 30 29 28   GLUCOSE 87 174* 129* 111* 122*  BUN 6 8 7 6 6   CREATININE 0.71 0.67 0.63 0.49* 0.53*  CALCIUM 7.8* 7.5* 7.6* 7.5* 7.5*  MG 1.1*  --   --   --   --   PHOS 2.0* 3.1 1.3* 1.3* 2.4*   Liver Function Tests:  Recent Labs  Lab 09/23/14 0905  09/25/14 0510 09/26/14 0438 09/27/14 0505 09/28/14 0510 09/29/14 0521  AST 41  --   --   --   --   --   --   ALT 14*  --   --   --   --   --   --   ALKPHOS 108  --   --   --   --   --   --   BILITOT 0.6  --   --   --   --   --   --   PROT 6.5  --   --   --   --   --   --   ALBUMIN 2.7*  < > 2.4* 2.6* 2.5* 2.3* 2.4*  < > = values in this interval not displayed.  Recent Labs Lab 09/23/14 0905  LIPASE 45   No results for input(s): AMMONIA in the last 168 hours. CBC:  Recent  Labs Lab 09/23/14 0905  09/25/14 0510 09/26/14 0438 09/27/14 0505 09/28/14 0510 09/29/14 0521  WBC 4.5  < > 4.0 6.0 6.2 4.9 5.2  NEUTROABS 3.0  --   --   --   --   --   --   HGB 13.7  < > 12.5* 12.7* 11.8* 11.4* 12.2*  HCT 41.5  < > 38.4* 38.7* 36.9* 34.9* 37.4*  MCV 91.2  < > 91.6 89.8 90.7 90.6 91.2  PLT 194  < > 171 167 163 142* 148*  < > = values in this interval not displayed. Cardiac Enzymes: No results for input(s): CKTOTAL, CKMB, CKMBINDEX, TROPONINI in the last 168 hours. BNP (last 3 results) No results for input(s): BNP in the last 8760 hours.  ProBNP (last 3 results) No results for input(s): PROBNP in the last 8760 hours.  CBG: No results for input(s): GLUCAP in the last 168 hours.  No results found for this or any previous visit (from the past 240 hour(s)).   Studies: Dg Abd Portable 1v  09/27/2014   CLINICAL DATA:  Ileus. Malignant mesothelioma and esophageal obstruction.  EXAM: PORTABLE ABDOMEN - 1 VIEW  COMPARISON:  None.  FINDINGS: A stent is again seen across the distal esophagus. No evidence of dilated bowel loops. No other significant radiographic abnormality identified.  IMPRESSION: Normal bowel gas pattern.  Distal esophageal stent again seen.   Electronically Signed   By: Earle Gell M.D.   On: 09/27/2014 13:57    Scheduled Meds: . dexamethasone  2 mg Intravenous Q12H  . famotidine (PEPCID) IV  20 mg Intravenous Q12H  . lactose free nutrition  237 mL Oral TID WC  . levothyroxine  12.5 mcg Intravenous Daily  . metoCLOPramide (REGLAN) injection  10 mg Intravenous 4 times per day  . ondansetron (ZOFRAN) IV  8 mg Intravenous 3 times per day  . scopolamine  1 patch Transdermal Q72H  . sucralfate  1 g Oral TID WC & HS   Continuous Infusions: . dextrose 5 % and 0.45 % NaCl with KCl 20 mEq/L 100 mL/hr at 09/29/14 5643    Principal Problem:   Esophageal obstruction Active Problems:   Esophageal pain   Malignant mesothelioma of pleura   GERD  (gastroesophageal reflux disease)   Hematemesis   Gastroesophageal reflux disease with esophagitis   Protein-calorie malnutrition, severe   Ascites  Marzetta Board  [facility] Pager [phone]. If 7PM-7AM, please contact night-coverage at [email or web address], password [facility] 09/29/2014, 8:58 AM  LOS: 6 days

## 2014-09-29 NOTE — Progress Notes (Signed)
[place] [facility]-RN Visit-Stacie Delice Lesch RN, BSN  This is a related, covered admission to [facility] diagnosis of mesothelioma. Patient is a DNR. Patient seen in room sitting in bed, eating ice chips. His wife Eston Esters and sons are at bedside. Patient reports he is feeling better this morning.  Per patient, this is this first day he has felt an improvement in nausea/vomiting, since admission. He stated he was able to keep down a couple of bites of his breakfast this morning.  Per MD, possible discharge tomorrow. Family denies the need for any additional equipment at this time.  Patient denies any pain. Per chart review, 2 doses of 1mg  Morphine IV given the past 24 hours.  [facility] will continue to monitor and anticipate any discharge needs.  Please call [facility] with any questions or concerns.  [facility] Liaison [phone]

## 2014-09-29 NOTE — Progress Notes (Signed)
Key Points: Use following P&T approved IV to PO non-antibiotic change policy.  Description contains the criteria that are approved Note: Policy Excludes:  Esophagectomy patientsPHARMACIST - PHYSICIAN COMMUNICATION DR:   Cruzita Lederer CONCERNING: IV to Oral Route Change Policy  RECOMMENDATION: This patient is receiving pepcid by the intravenous route.  Based on criteria approved by the Pharmacy and Therapeutics Committee, the intravenous medication(s) is/are being converted to the equivalent oral dose form(s).   DESCRIPTION: These criteria include:  The patient is eating (either orally or via tube) and/or has been taking other orally administered medications for a least 24 hours  The patient has no evidence of active gastrointestinal bleeding or impaired GI absorption (gastrectomy, short bowel, patient on TNA or NPO).  If you have questions about this conversion, please contact the Pharmacy Department  []   [phone] )  [facility] []   [phone] )  [facility] Pontes  []   [phone] )  [facility] [x]   [phone] )  [place], [facility] 09/29/2014 12:42 PM

## 2014-09-30 ENCOUNTER — Inpatient Hospital Stay (HOSPITAL_COMMUNITY)

## 2014-09-30 LAB — RENAL FUNCTION PANEL
ALBUMIN: 2.4 g/dL — AB (ref 3.5–5.0)
ANION GAP: 6 (ref 5–15)
BUN: 5 mg/dL — ABNORMAL LOW (ref 6–20)
CHLORIDE: 101 mmol/L (ref 101–111)
CO2: 28 mmol/L (ref 22–32)
Calcium: 7.6 mg/dL — ABNORMAL LOW (ref 8.9–10.3)
Creatinine, Ser: 0.52 mg/dL — ABNORMAL LOW (ref 0.61–1.24)
GFR calc non Af Amer: >60 mL/min (ref >60)
GLUCOSE: 127 mg/dL — AB (ref 65–99)
PHOSPHORUS: 2.4 mg/dL — AB (ref 2.5–4.6)
POTASSIUM: 3.7 mmol/L (ref 3.5–5.1)
Sodium: 135 mmol/L (ref 135–145)

## 2014-09-30 LAB — CBC
HCT: 35.3 % — ABNORMAL LOW (ref 39.0–52.0)
Hemoglobin: 11.6 g/dL — ABNORMAL LOW (ref 13.0–17.0)
MCH: 30 pg (ref 26.0–34.0)
MCHC: 32.9 g/dL (ref 30.0–36.0)
MCV: 91.2 fL (ref 78.0–100.0)
PLATELETS: 167 10*3/uL (ref 150–400)
RBC: 3.87 MIL/uL — ABNORMAL LOW (ref 4.22–5.81)
RDW: 15.4 % (ref 11.5–15.5)
WBC: 5 10*3/uL (ref 4.0–10.5)

## 2014-09-30 MED ORDER — LORAZEPAM 0.5 MG PO TABS: 0.5000 mg | ORAL_TABLET | Freq: Three times a day (TID) | ORAL | Status: AC | PRN

## 2014-09-30 MED ORDER — HYDROCOD POLST-CPM POLST ER 10-8 MG/5ML PO SUER: 5.0000 mL | Freq: Two times a day (BID) | ORAL | Status: AC

## 2014-09-30 MED ORDER — BISACODYL 10 MG RE SUPP: 10.0000 mg | Freq: Every day | RECTAL | Status: AC | PRN

## 2014-09-30 MED ORDER — FAMOTIDINE 20 MG PO TABS: 20.0000 mg | ORAL_TABLET | Freq: Two times a day (BID) | ORAL | Status: AC

## 2014-09-30 MED ORDER — DEXAMETHASONE 2 MG PO TABS: 2.0000 mg | ORAL_TABLET | Freq: Two times a day (BID) | ORAL | Status: AC

## 2014-09-30 MED ORDER — SCOPOLAMINE 1 MG/3DAYS TD PT72: 1.0000 | MEDICATED_PATCH | TRANSDERMAL | Status: AC

## 2014-09-30 MED ORDER — BENZONATATE 100 MG PO CAPS: 100.0000 mg | ORAL_CAPSULE | Freq: Three times a day (TID) | ORAL | Status: AC

## 2014-09-30 MED ORDER — METOCLOPRAMIDE HCL 10 MG PO TABS: 10.0000 mg | ORAL_TABLET | Freq: Four times a day (QID) | ORAL | Status: AC

## 2014-09-30 MED ORDER — ONDANSETRON HCL 4 MG PO TABS: 4.0000 mg | ORAL_TABLET | Freq: Three times a day (TID) | ORAL | Status: DC | PRN

## 2014-09-30 MED ORDER — AZITHROMYCIN 200 MG/5ML PO SUSR
250.0000 mg | Freq: Every day | ORAL | Status: AC
Start: 2014-09-30 — End: ?

## 2014-09-30 MED ORDER — ONDANSETRON HCL 4 MG/5ML PO SOLN: 4.0000 mg | Freq: Three times a day (TID) | ORAL | Status: AC | PRN

## 2014-09-30 NOTE — Progress Notes (Signed)
Palliative Care Team at [facility] Note   SUBJECTIVE: Much improved nausea. He was able to eat his breakfast and he walked down the hall. His major complaint now is a persistent cough and mucous production that he feels is coming from his esophagus- he reports this all started after stent placement.  Interval Events: Stent placed on 7/5  OBJECTIVE: Vital Signs: BP 112/76 mmHg  Pulse 78  Temp(Src) 97.9 F (36.6 C) (Oral)  Resp 18  Ht 5\' 11"  (1.803 m)  Wt 65.227 kg (143 lb 12.8 oz)  BMI 20.06 kg/m2  SpO2 93%   Intake and Output: 07/09 0701 - 07/10 0700 In: 1920 [P.O.:720; I.V.:1200] Out: -   Physical Exam: Vital signs viewed-exam is unchanged-he appears to have more energy and is in much less distress today.  Allergies  Allergen Reactions  . Penicillins Rash  . Sulfa Antibiotics Rash    Medications: Scheduled Meds:  . benzonatate  100 mg Oral TID  . chlorpheniramine-HYDROcodone  5 mL Oral Q12H  . dexamethasone  2 mg Intravenous Q12H  . lactose free nutrition  237 mL Oral TID WC  . levothyroxine  12.5 mcg Intravenous Daily  . metoCLOPramide (REGLAN) injection  10 mg Intravenous 4 times per day  . ondansetron (ZOFRAN) IV  8 mg Intravenous 3 times per day  . ranitidine  150 mg Oral BID  . scopolamine  1 patch Transdermal Q72H  . sucralfate  1 g Oral TID WC & HS    Continuous Infusions: . dextrose 5 % and 0.45 % NaCl with KCl 20 mEq/L 100 mL/hr at 09/30/14 0044    PRN Meds: bisacodyl, fentaNYL (SUBLIMAZE) injection, gi cocktail, ipratropium-albuterol, LORazepam, morphine injection, ondansetron (ZOFRAN) IV, phenol  Labs: CBC    Component Value Date/Time   WBC 5.2 09/29/2014 0521   WBC 8.7 07/16/2013 1254   RBC 4.10* 09/29/2014 0521   RBC 3.76* 07/16/2013 1254   HGB 12.2* 09/29/2014 0521   HGB 11.5* 07/16/2013 1254   HCT 37.4* 09/29/2014 0521   HCT 35.1* 07/16/2013 1254   PLT 148* 09/29/2014 0521   PLT 89* 07/16/2013 1254   MCV 91.2 09/29/2014  0521   MCV 93 07/16/2013 1254   MCH 29.8 09/29/2014 0521   MCH 30.5 07/16/2013 1254   MCHC 32.6 09/29/2014 0521   MCHC 32.7 07/16/2013 1254   RDW 15.3 09/29/2014 0521   RDW 23.8* 07/16/2013 1254   LYMPHSABS 0.6* 09/23/2014 0905   LYMPHSABS 1.1 07/16/2013 1254   MONOABS 0.9 09/23/2014 0905   MONOABS 1.6* 07/16/2013 1254   EOSABS 0.0 09/23/2014 0905   EOSABS 0.1 07/16/2013 1254   BASOSABS 0.0 09/23/2014 0905   BASOSABS 0.0 07/16/2013 1254    CMET     Component Value Date/Time   NA 134* 09/29/2014 0521   NA 137 07/16/2013 1254   K 3.7 09/29/2014 0521   K 3.4* 07/16/2013 1254   CL 99* 09/29/2014 0521   CL 101 07/16/2013 1254   CO2 28 09/29/2014 0521   CO2 30 07/16/2013 1254   GLUCOSE 122* 09/29/2014 0521   GLUCOSE 95 07/16/2013 1254   BUN 6 09/29/2014 0521   BUN 8 07/16/2013 1254   CREATININE 0.53* 09/29/2014 0521   CREATININE 0.73 07/16/2013 1254   CALCIUM 7.5* 09/29/2014 0521   CALCIUM 8.7 07/16/2013 1254   PROT 6.5 09/23/2014 0905   PROT 7.5 07/16/2013 1254   ALBUMIN 2.4* 09/29/2014 0521   ALBUMIN 3.2* 07/16/2013 1254   AST 41 09/23/2014 0905  AST 34 07/16/2013 1254   ALT 14* 09/23/2014 0905   ALT 25 07/16/2013 1254   ALKPHOS 108 09/23/2014 0905   ALKPHOS 127* 07/16/2013 1254   BILITOT 0.6 09/23/2014 0905   GFRNONAA >60 09/29/2014 0521   GFRNONAA >60 07/16/2013 1254   GFRAA >60 09/29/2014 0521   GFRAA >60 07/16/2013 1254    ASSESSMENT/ PLAN: 1. Refractory Nausea/Vomiting/Regurgitation: Continue IV regimen for now- appears to be very effective.  2. Cough: Started BID hydrocodone in Tussionex for and Tessalon TID- suspect this is cough from pleural irritation as well as from airway irritation from stent. Will add on Duonebs for bronchodilation and spasm. Ipratropium may help with copious mucous.  Time: 10-1035AM 35 min  Greater than 50%  of this time was spent counseling and coordinating care related to the above assessment and plan.   Acquanetta Chain, DO  09/30/2014, 1:13 AM  Please contact Palliative Medicine Team phone at [phone] for questions and concerns.

## 2014-09-30 NOTE — Discharge Summary (Signed)
Physician Discharge Summary  Chase Marsh JKK:[id number] DOB: [birth year] DOA: 09/23/2014  PCP: Leonard Downing, MD  Admit date: 09/23/2014 Discharge date: 09/30/2014  Time spent: 20 minutes  Recommendations for Outpatient Follow-up:  1. Follow up with PCP in 1-2 weeks  Discharge Diagnoses:  Principal Problem:   Esophageal obstruction Active Problems:   Esophageal pain   Malignant mesothelioma of pleura   GERD (gastroesophageal reflux disease)   Hematemesis   Gastroesophageal reflux disease with esophagitis   Protein-calorie malnutrition, severe   Ascites   Discharge Condition: Improved  Diet recommendation: Dysphagia 1 with thin liquids  Filed Weights   09/25/14 0739  Weight: 65.227 kg (143 lb 12.8 oz)    History of present illness:  Please see admit h and p from 7/3 for details. Briefly, pt presented with nausea/vomiting in the setting of metastatic mesothelioma. The patient was admitted for further work up.  Hospital Course:  1. Esophageal obstruction secondary to progressive malignancy 1. Pt was followed by GI and is s/p esophageal stenting by Dr. Watt Climes on 7/5 2. Patient's course was complicated by continued nausea and difficulty tolerating PO post-stent placement 3. Palliative Care was consulted for assistance with symptom management. See discharge med list for new meds 4. Patient now able to eat and reports feeling much better 5. Cont supportive care 2. BHP 1. Remained stable 3. Irregular heart rhythm 1. PAC's were noted on tele recently this admit, not in afib 4. Hypokalemia 1. Replaced, normalized 5. Metastatic mesothelioma 1. Under hospice care 6. Severe protein calorie malnutrition 1. Nutrition has seen in consultation 7. DVT prophylaxis 1. SCD's while inpatient 8. Diarrhea 1. Likely secondary to reglan 2. Afebrile, no leukocytosis 3. Had removed contact precautions  Procedures:  Esophageal stenting 7/5  Consultations:  GI  Palliative  Care  Discharge Exam: Filed Vitals:   09/29/14 0528 09/29/14 1508 09/29/14 2125 09/30/14 0517  BP: 99/69 105/70 112/76 102/64  Pulse: 86 87 78 72  Temp: 98 F (36.7 C) 97.9 F (36.6 C) 97.9 F (36.6 C) 97.7 F (36.5 C)  TempSrc: Oral Axillary Oral Oral  Resp: 20 20 18 18   Height:      Weight:      SpO2: 94% 94% 93% 97%    General: Awake, in nad Cardiovascular: regular, s1, s2 Respiratory: normal resp effort, no wheezing  Discharge Instructions     Medication List    STOP taking these medications        magnesium oxide 400 MG tablet  Commonly known as:  MAG-OX      TAKE these medications        azithromycin 200 MG/5ML suspension  Commonly known as:  ZITHROMAX  Take 6.3 mLs (250 mg total) by mouth daily.     benzonatate 100 MG capsule  Commonly known as:  TESSALON  Take 1 capsule (100 mg total) by mouth 3 (three) times daily.     bisacodyl 10 MG suppository  Commonly known as:  DULCOLAX  Place 1 suppository (10 mg total) rectally daily as needed for moderate constipation.     chlorpheniramine-HYDROcodone 10-8 MG/5ML Suer  Commonly known as:  TUSSIONEX  Take 5 mLs by mouth every 12 (twelve) hours.     dexamethasone 2 MG tablet  Commonly known as:  DECADRON  Take 1 tablet (2 mg total) by mouth 2 (two) times daily.     famotidine 20 MG tablet  Commonly known as:  PEPCID  Take 1 tablet (20 mg total) by mouth 2 (two)  times daily.     levothyroxine 25 MCG tablet  Commonly known as:  SYNTHROID, LEVOTHROID  Take 25 mcg by mouth daily before breakfast.     LORazepam 0.5 MG tablet  Commonly known as:  ATIVAN  Take 1 tablet (0.5 mg total) by mouth every 8 (eight) hours as needed for anxiety or sleep.     metoCLOPramide 10 MG tablet  Commonly known as:  REGLAN  Take 1 tablet (10 mg total) by mouth 4 (four) times daily.     ondansetron 4 MG/5ML solution  Commonly known as:  ZOFRAN  Take 5 mLs (4 mg total) by mouth every 8 (eight) hours as needed for nausea  or vomiting.     scopolamine 1 MG/3DAYS  Commonly known as:  TRANSDERM-SCOP  Place 1 patch (1.5 mg total) onto the skin every 3 (three) days.     sucralfate 1 GM/10ML suspension  Commonly known as:  CARAFATE  Take 1 g by mouth 4 (four) times daily -  with meals and at bedtime.       Allergies  Allergen Reactions  . Penicillins Rash  . Sulfa Antibiotics Rash   Follow-up Information    Follow up with Leonard Downing, MD. Schedule an appointment as soon as possible for a visit in 1 week.   Specialty:  Family Medicine   Why:  Hospital follow up   Contact information:   [place] [id number] [phone]        The results of significant diagnostics from this hospitalization (including imaging, microbiology, ancillary and laboratory) are listed below for reference.    Significant Diagnostic Studies: Dg Chest 1 View  09/25/2014   CLINICAL DATA:  Dysphagia.  Esophageal stent placement  EXAM: CHEST  1 VIEW  COMPARISON:  Chest CT 09/23/2014  FINDINGS: Three intraoperative spot images from endoscopy demonstrate endoscope within the esophagus. Final images demonstrate placement of mid to distal esophageal stent, extending to the GE junction.  IMPRESSION: Placement of mid to distal esophageal stent.   Electronically Signed   By: Rolm Baptise M.D.   On: 09/25/2014 10:10   Dg Abd 1 View  09/25/2014   CLINICAL DATA:  Dysphagia with esophageal stent placement  EXAM: DG C-ARM 1-60 MIN - NRPT [facility]; ABDOMEN - 1 VIEW  COMPARISON:  None.  FLUOROSCOPY TIME:  Fluoroscopy Time:  2 minutes 6 seconds  Number of Acquired Images:  0  FINDINGS: There is an esophageal stent in the distal esophageal region. There is no bowel dilatation or air-fluid level suggesting obstruction. No free air. No abnormal calcifications.  IMPRESSION: Stent and distal esophagus extending across the gastroesophageal junction. Bowel gas pattern unremarkable.   Electronically Signed   By: Lowella Grip III  M.D.   On: 09/25/2014 17:00   Ct Chest W Contrast  09/23/2014   CLINICAL DATA:  Epigastric burning/ pain without relief. Vomiting. History of end-stage mesothelioma. Esophageal pain.  EXAM: CT CHEST WITH CONTRAST  TECHNIQUE: Multidetector CT imaging of the chest was performed during intravenous contrast administration.  CONTRAST:  74mL OMNIPAQUE IOHEXOL 300 MG/ML  SOLN  COMPARISON:  02/09/2014 abdominal pelvic CT. Chest radiograph of 07/16/2013. Most recent chest CT of 09/15/2005.  FINDINGS: Mediastinum/Nodes: Low right jugular 1.0 cm node on image 9 is suspicious for nodal metastasis. Tortuous thoracic aorta. Mild cardiomegaly with multivessel coronary artery atherosclerosis. No central pulmonary embolism, on this non-dedicated study.  No mediastinal or hilar adenopathy. The esophagus is compressed by pleural base masses, including on  image 44 of series 2. No contrast extravasation. The more cephalad esophagus is contrast filled and dilated, including on image 15 of series 2. A small hiatal hernia.  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 Right-sided pleural-based masses, consistent with the clinical history of mesothelioma. An index low anterior right pleural mass measures 3.7 x 3.0 cm on image 33. Enlarged from 1.5 x 1.3 cm on 02/09/2014 (when remeasured). Left-sided pleural based mass or node measures 3.4 x 1.9 cm on image 51 versus 1.0 x 2.2 cm on 02/09/2014.  Mild degradation secondary to patient arms not being raised above the head.  No pulmonary parenchymal abnormality.  Upper abdomen: Normal imaged portions of the liver, spleen, stomach, right adrenal gland. New soft tissue mass within the right upper quadrant, including on image 45 of series 2. This surrounds the hepatic flexure colon. Concurrent new upper abdominal ascites.  Musculoskeletal: Subtle sclerosis involving lower lateral right ribs, including on image 53 of series 2.  IMPRESSION: 1.  Progressive right worse than left pleural masses, consistent with the clinical history of mesothelioma. 2. Significant mass effect upon the distal esophagus by pleural-based masses. This causes partial obstruction, as evidenced by dilatation and contrast superiorly. 3. Development of right upper quadrant peritoneal mass and abdominal ascites. This is consistent with either abdominal metastatic disease or tran diaphragmatic spread of right pleural primary. 4.  Atherosclerosis, including within the coronary arteries. 5. Low right jugular node is mildly enlarged and suspicious for metastatic disease. 6. Subtle sclerosis within lower right ribs. Favored to be related to remote trauma.   Electronically Signed   By: Abigail Miyamoto M.D.   On: 09/23/2014 12:43   Dg Chest Port 1 View  09/30/2014   CLINICAL DATA:  Cough today. Patient with esophageal stent and history of mesothelioma.  EXAM: PORTABLE CHEST - 1 VIEW  COMPARISON:  09/23/2014 CT and prior studies  FINDINGS: Right pleural thickening and masses are again identified.  Atelectasis/scarring within the mid and lower right lung are identified.  The cardiomediastinal silhouette is unchanged.  There is no evidence of pleural effusion or pneumothorax.  IMPRESSION: Little significant change since recent CT. Right pleural thickening/ masses and right mid and lower lung atelectasis/scarring again noted.   Electronically Signed   By: Margarette Canada M.D.   On: 09/30/2014 13:05   Dg Abd Portable 1v  09/27/2014   CLINICAL DATA:  Ileus. Malignant mesothelioma and esophageal obstruction.  EXAM: PORTABLE ABDOMEN - 1 VIEW  COMPARISON:  None.  FINDINGS: A stent is again seen across the distal esophagus. No evidence of dilated bowel loops. No other significant radiographic abnormality identified.  IMPRESSION: Normal bowel gas pattern.  Distal esophageal stent again seen.   Electronically Signed   By: Earle Gell M.D.   On: 09/27/2014 13:57   Dg C-arm 1-60 Min-no Report  09/25/2014    CLINICAL DATA: reflux; esophageal stent   C-ARM 1-60 MINUTES  Fluoroscopy was utilized by the requesting physician.  No radiographic  interpretation.     Microbiology: No results found for this or any previous visit (from the past 240 hour(s)).   Labs: Basic Metabolic Panel:  Recent Labs Lab 09/25/14 0510 09/26/14 4097 09/27/14 0505 09/28/14 0510 09/29/14 0521 09/30/14 0523  NA 139 135 135 134* 134* 135  K 4.2 4.0 3.8 3.4* 3.7 3.7  CL 103 102 100* 100* 99* 101  CO2 28 24 30 29 28 28   GLUCOSE 87 174* 129* 111* 122* 127*  BUN 6 8 7 6 6  5*  CREATININE 0.71 0.67 0.63 0.49* 0.53* 0.52*  CALCIUM 7.8* 7.5* 7.6* 7.5* 7.5* 7.6*  MG 1.1*  --   --   --   --   --   PHOS 2.0* 3.1 1.3* 1.3* 2.4* 2.4*   Liver Function Tests:  Recent Labs Lab 09/26/14 0438 09/27/14 0505 09/28/14 0510 09/29/14 0521 09/30/14 0523  ALBUMIN 2.6* 2.5* 2.3* 2.4* 2.4*   No results for input(s): LIPASE, AMYLASE in the last 168 hours. No results for input(s): AMMONIA in the last 168 hours. CBC:  Recent Labs Lab 09/26/14 0438 09/27/14 0505 09/28/14 0510 09/29/14 0521 09/30/14 0523  WBC 6.0 6.2 4.9 5.2 5.0  HGB 12.7* 11.8* 11.4* 12.2* 11.6*  HCT 38.7* 36.9* 34.9* 37.4* 35.3*  MCV 89.8 90.7 90.6 91.2 91.2  PLT 167 163 142* 148* 167   Cardiac Enzymes: No results for input(s): CKTOTAL, CKMB, CKMBINDEX, TROPONINI in the last 168 hours. BNP: BNP (last 3 results) No results for input(s): BNP in the last 8760 hours.  ProBNP (last 3 results) No results for input(s): PROBNP in the last 8760 hours.  CBG: No results for input(s): GLUCAP in the last 168 hours.   Signed:  CHIU, Orpah Melter  [facility] 09/30/2014, 1:27 PM

## 2014-09-30 NOTE — Progress Notes (Signed)
Pt left at this time with his family. Alert, oriented, and without c/o. Discharge instructions/prescriptions given/explained with pt verbalizing understanding. Followup appt noted. Pt left with glasses.

## 2014-11-22 DEATH — deceased

## 2016-06-18 IMAGING — CT CT CHEST W/ CM
2 of 3 series · 15 of 36 positions shown, 18 images · IV contrast (OMNIPAQUE 300)
Comparison: 02/09/2014 abdominal pelvic CT. Chest radiograph of
07/16/2013. Most recent chest CT of 09/15/2005.

CLINICAL DATA: Epigastric burning/ pain without relief. Vomiting.
History of end-stage mesothelioma. Esophageal pain.

EXAM:
CT CHEST WITH CONTRAST
TECHNIQUE: Multidetector CT imaging of the chest was performed during
intravenous contrast administration.
CONTRAST:  80mL OMNIPAQUE IOHEXOL 300 MG/ML  SOLN

[Series 2: chest with st · axial · 0.73mm/px · z∈[+1386,+1616]mm · 12 of 56 slices shown, 15 images]
[im 5/56  mediastinal]
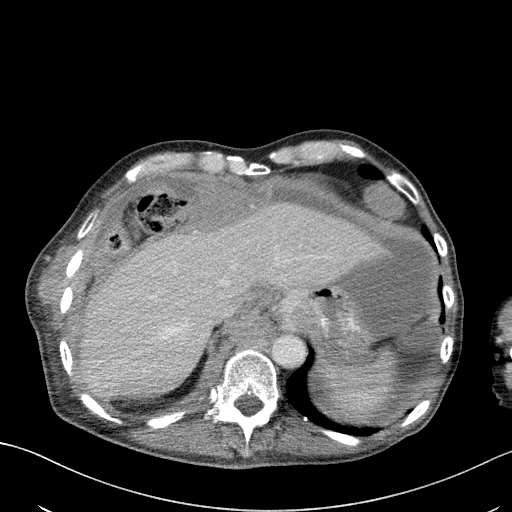
[im 5/56  lung]
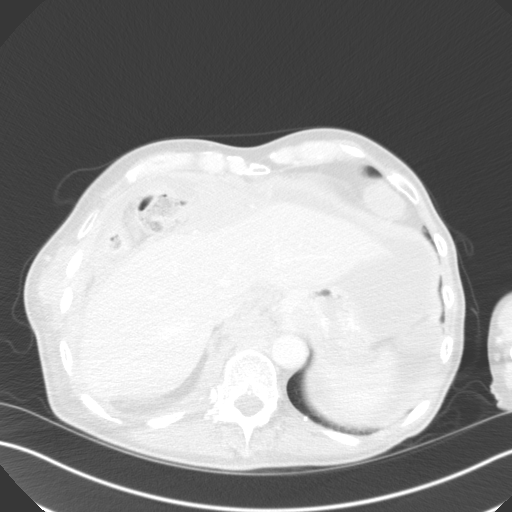
[im 9/56  lung]
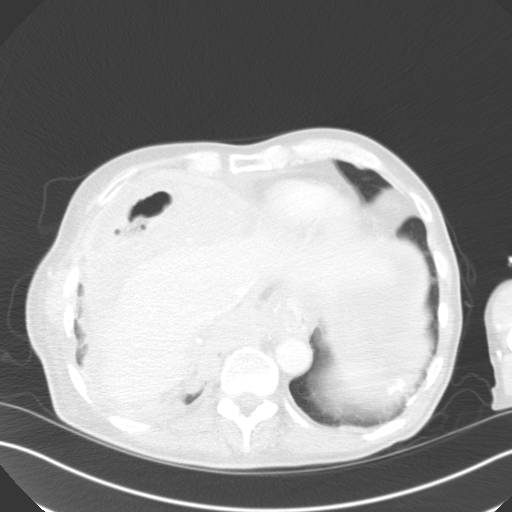
[im 13/56  lung]
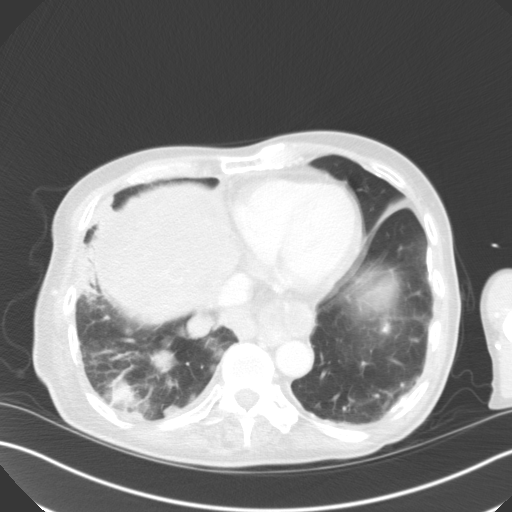
[im 17/56  lung]
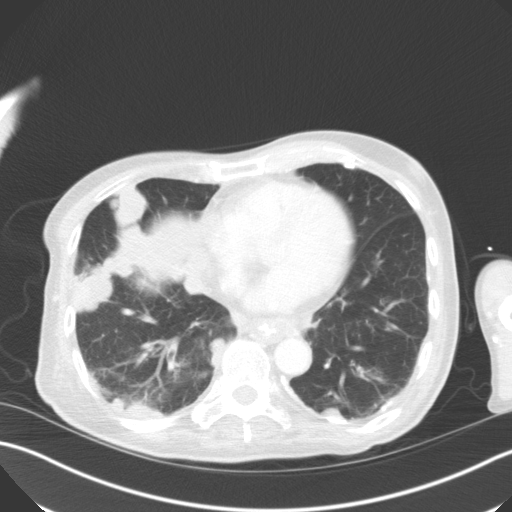
[im 21/56  mediastinal]
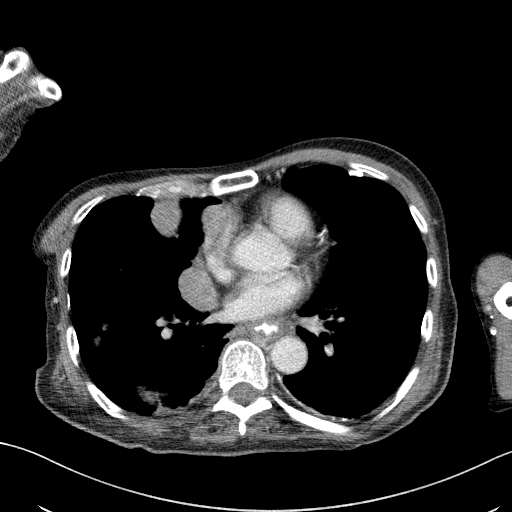
[im 21/56  lung]
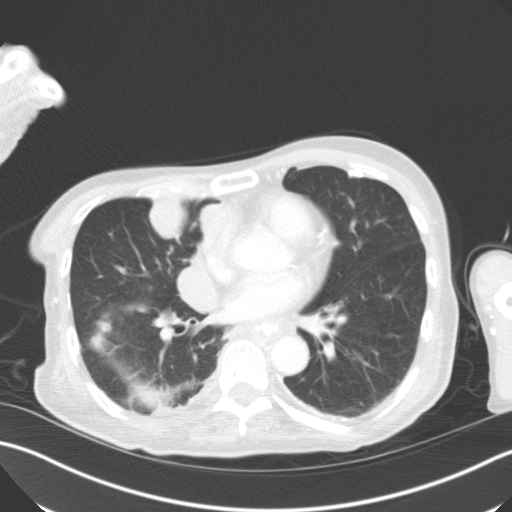
[im 25/56  lung]
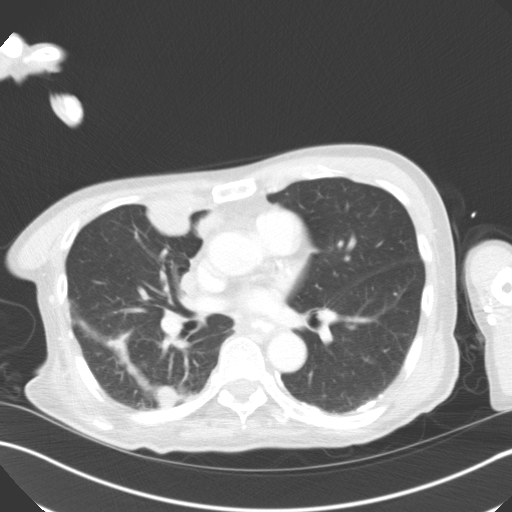
[im 31/56  lung]
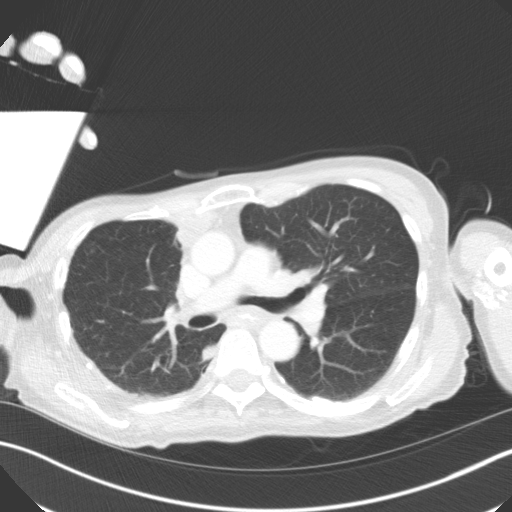
[im 35/56  lung]
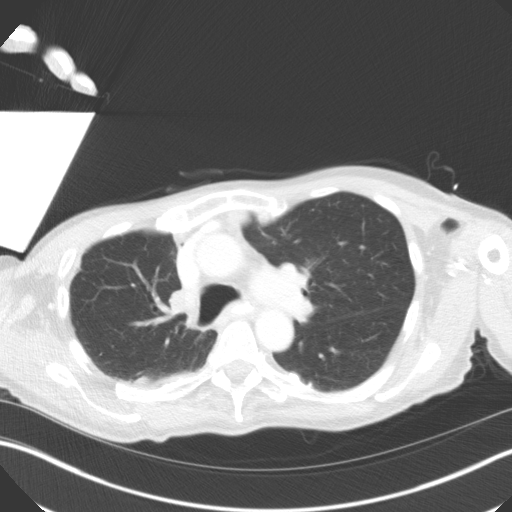
[im 39/56  mediastinal]
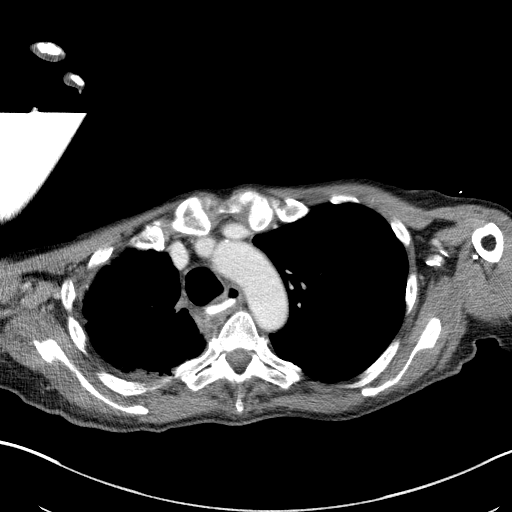
[im 39/56  lung]
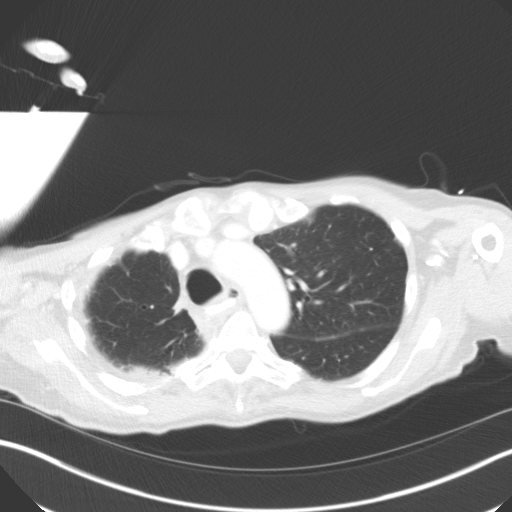
[im 43/56  lung]
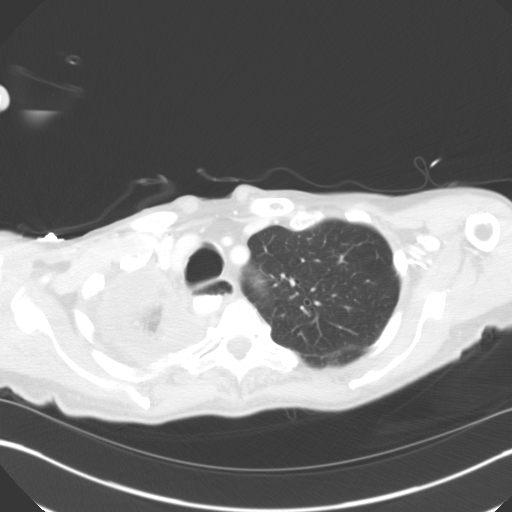
[im 47/56  lung]
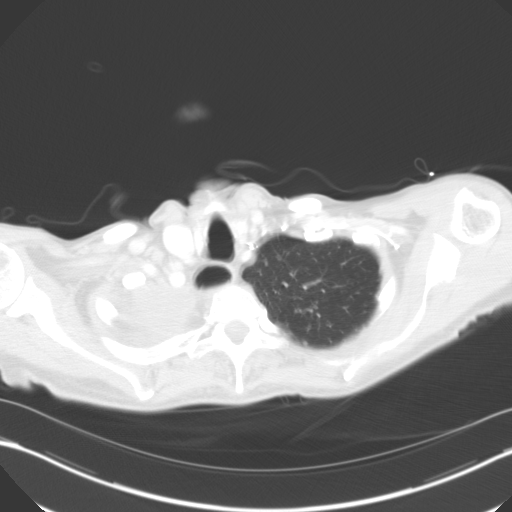
[im 51/56  lung]
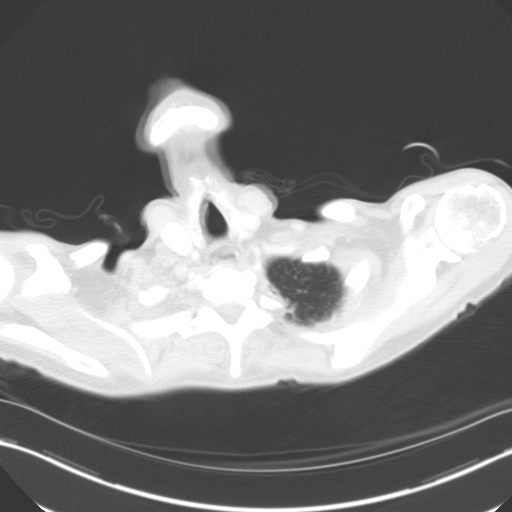

[Series 3: coronals · coronal · 0.59mm/px · 3 of 92 slices shown]
[im 19/92  lung]
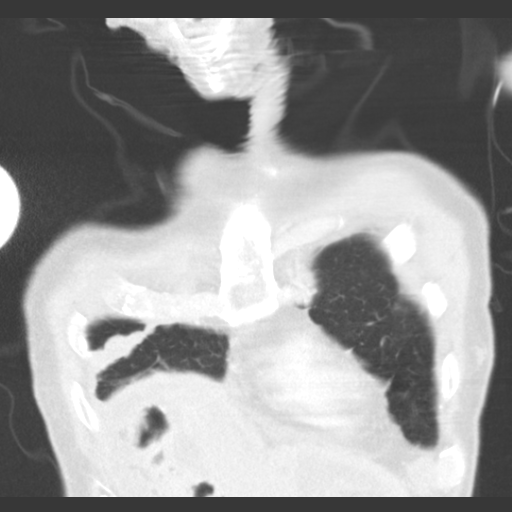
[im 37/92  lung]
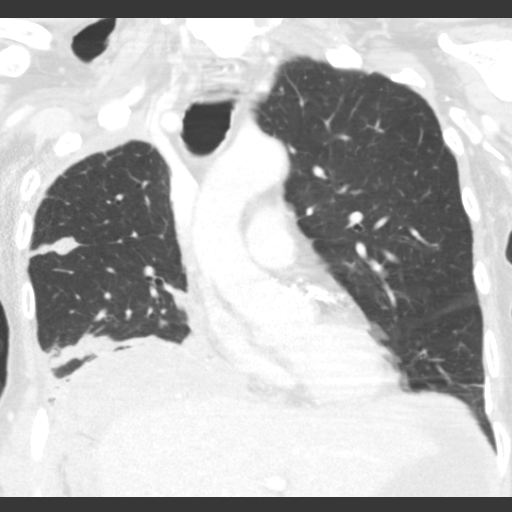
[im 55/92  lung]
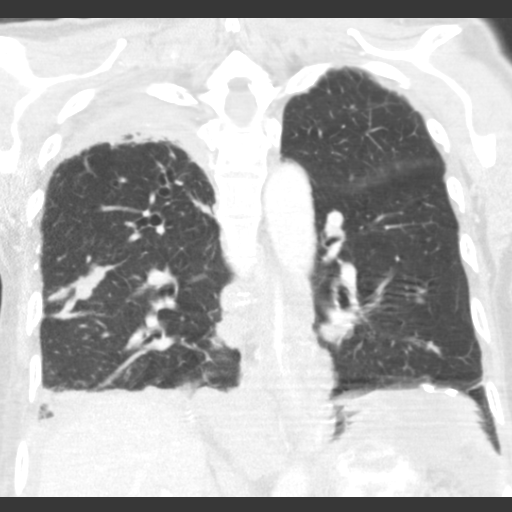

[15 of 36 positions shown; findings below may reference images not displayed]

FINDINGS: Mediastinum/Nodes: Low right jugular 1.0 cm node on image 9 is
suspicious for nodal metastasis. Tortuous thoracic aorta. Mild
cardiomegaly with multivessel coronary artery atherosclerosis. No
central pulmonary embolism, on this non-dedicated study.

No mediastinal or hilar adenopathy. The esophagus is compressed by
pleural base masses, including on image 44 of series 2. No contrast
extravasation. The more cephalad esophagus is contrast filled and
dilated, including on image 15 of series 2. A small hiatal hernia.

Lungs/Pleura: Asbestos related pleural disease, as evidenced by
bilateral calcified pleural plaques. Right-sided pleural-based
masses, consistent with the clinical history of mesothelioma. An
index low anterior right pleural mass measures 3.7 x 3.0 cm on image
33. Enlarged from 1.5 x 1.3 cm on 02/09/2014 (when remeasured).
Left-sided pleural based mass or node measures 3.4 x 1.9 cm on image
51 versus 1.0 x 2.2 cm on 02/09/2014.

Mild degradation secondary to patient arms not being raised above
the head.

No pulmonary parenchymal abnormality.

Upper abdomen: Normal imaged portions of the liver, spleen, stomach,
right adrenal gland. New soft tissue mass within the right upper
quadrant, including on image 45 of series 2. This surrounds the
hepatic flexure colon. Concurrent new upper abdominal ascites.

Musculoskeletal: Subtle sclerosis involving lower lateral right
ribs, including on image 53 of series 2.
IMPRESSION: 1. Progressive right worse than left pleural masses, consistent with
the clinical history of mesothelioma.
2. Significant mass effect upon the distal esophagus by
pleural-based masses. This causes partial obstruction, as evidenced
by dilatation and contrast superiorly.
3. Development of right upper quadrant peritoneal mass and abdominal
ascites. This is consistent with either abdominal metastatic disease
or tran diaphragmatic spread of right pleural primary.
4.  Atherosclerosis, including within the coronary arteries.
5. Low right jugular node is mildly enlarged and suspicious for
metastatic disease.
6. Subtle sclerosis within lower right ribs. Favored to be related
to remote trauma.

## 2016-06-20 IMAGING — DX DG ABDOMEN 1V
2 series · 2 of 2 positions shown · non-contrast
Comparison: None.

CLINICAL DATA: Dysphagia with esophageal stent placement

EXAM:
DG C-ARM 1-60 MIN - NRPT [facility]; ABDOMEN - 1 VIEW

[abdomen kub (1 of 2)]
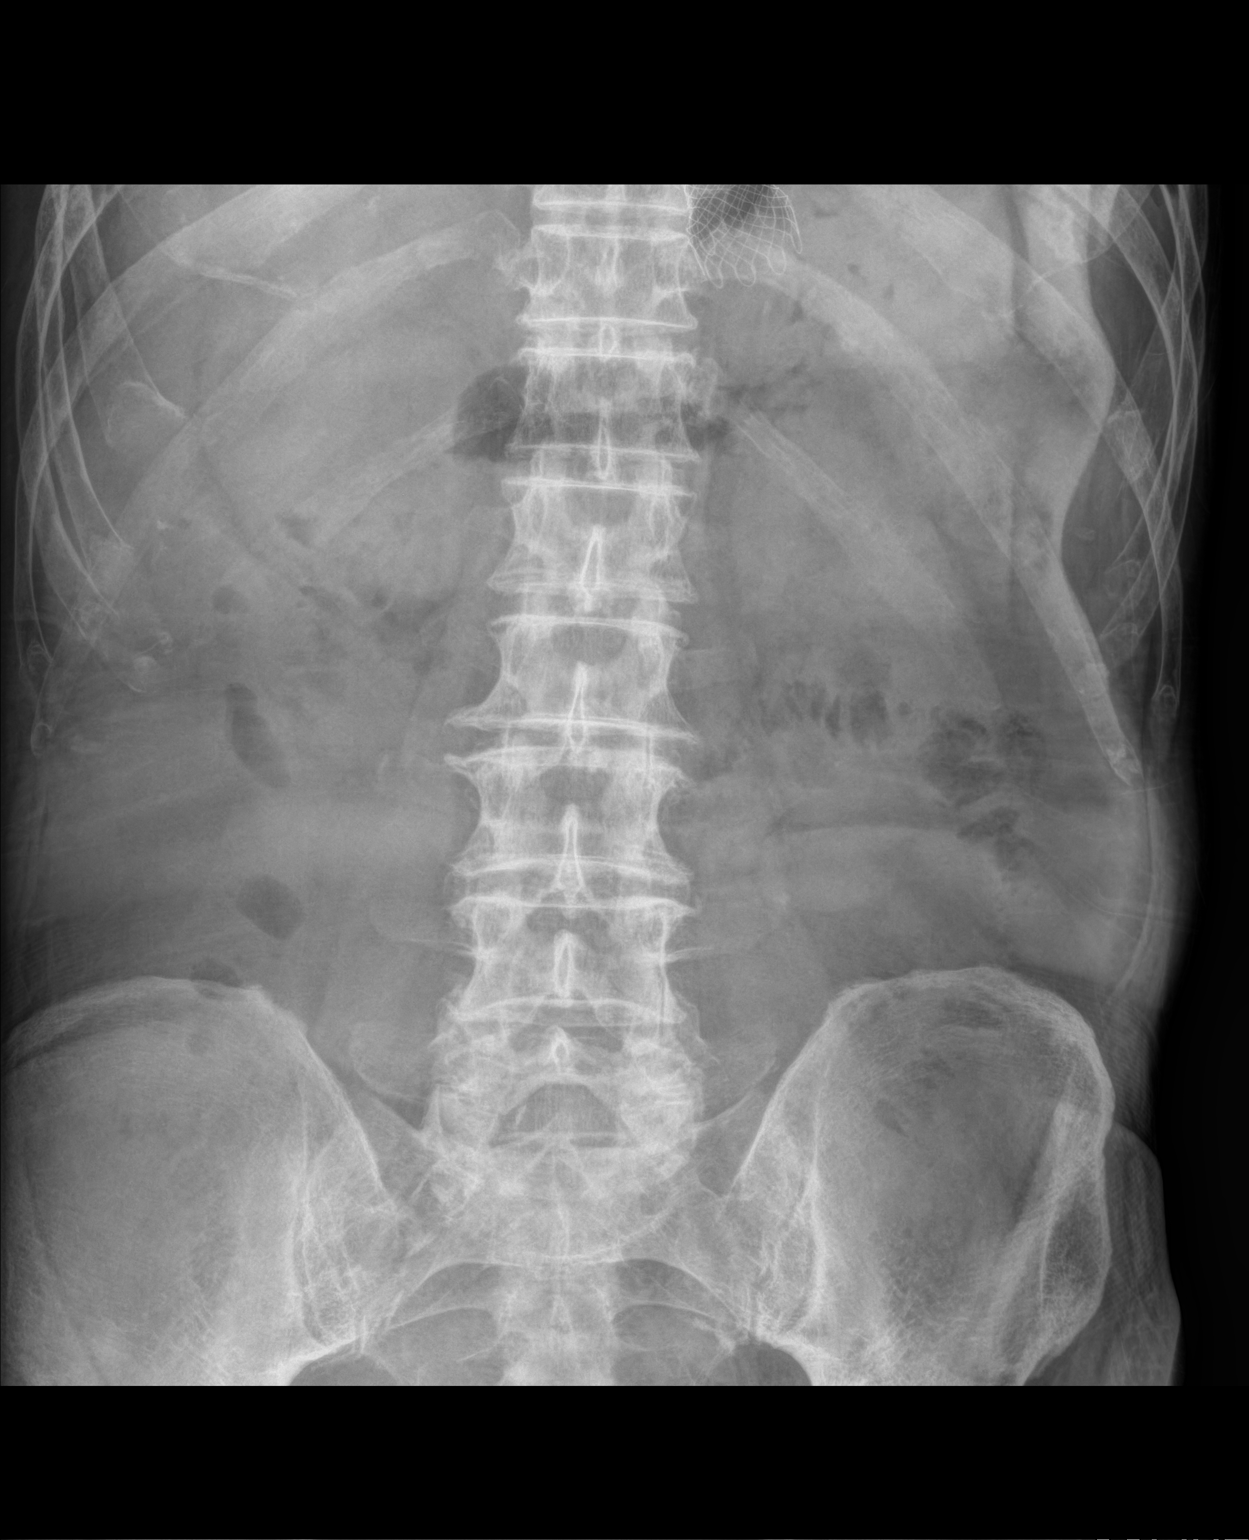

[abdomen kub (2 of 2)]
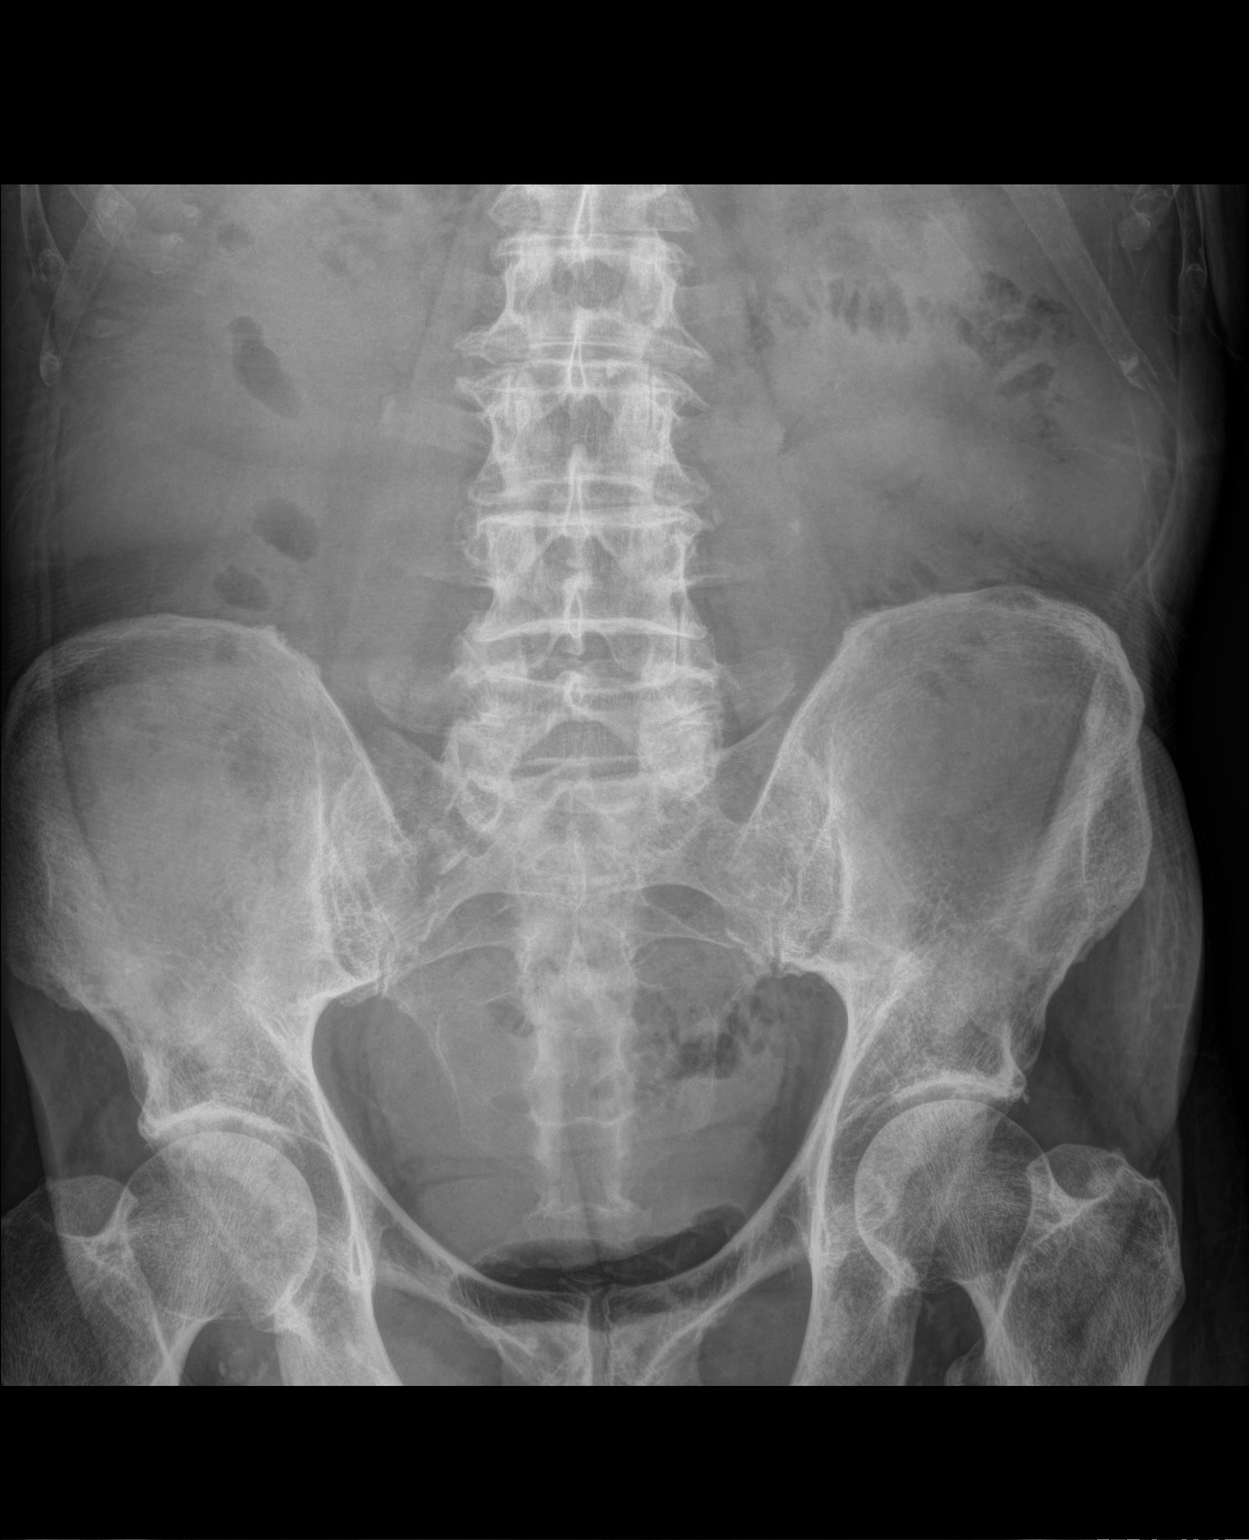

[2 of 2 positions shown; findings below may reference images not displayed]

FLUOROSCOPY TIME:  Fluoroscopy Time:  2 minutes 6 seconds

Number of Acquired Images:  0
FINDINGS: There is an esophageal stent in the distal esophageal region. There
is no bowel dilatation or air-fluid level suggesting obstruction. No
free air. No abnormal calcifications.
IMPRESSION: Stent and distal esophagus extending across the gastroesophageal
junction. Bowel gas pattern unremarkable.

## 2016-06-25 IMAGING — CR DG CHEST 1V PORT
1 series · 1 of 1 positions shown · non-contrast
Comparison: 09/23/2014 CT and prior studies

CLINICAL DATA: Cough today. Patient with esophageal stent and
history of mesothelioma.

EXAM:
PORTABLE CHEST - 1 VIEW

[AP]
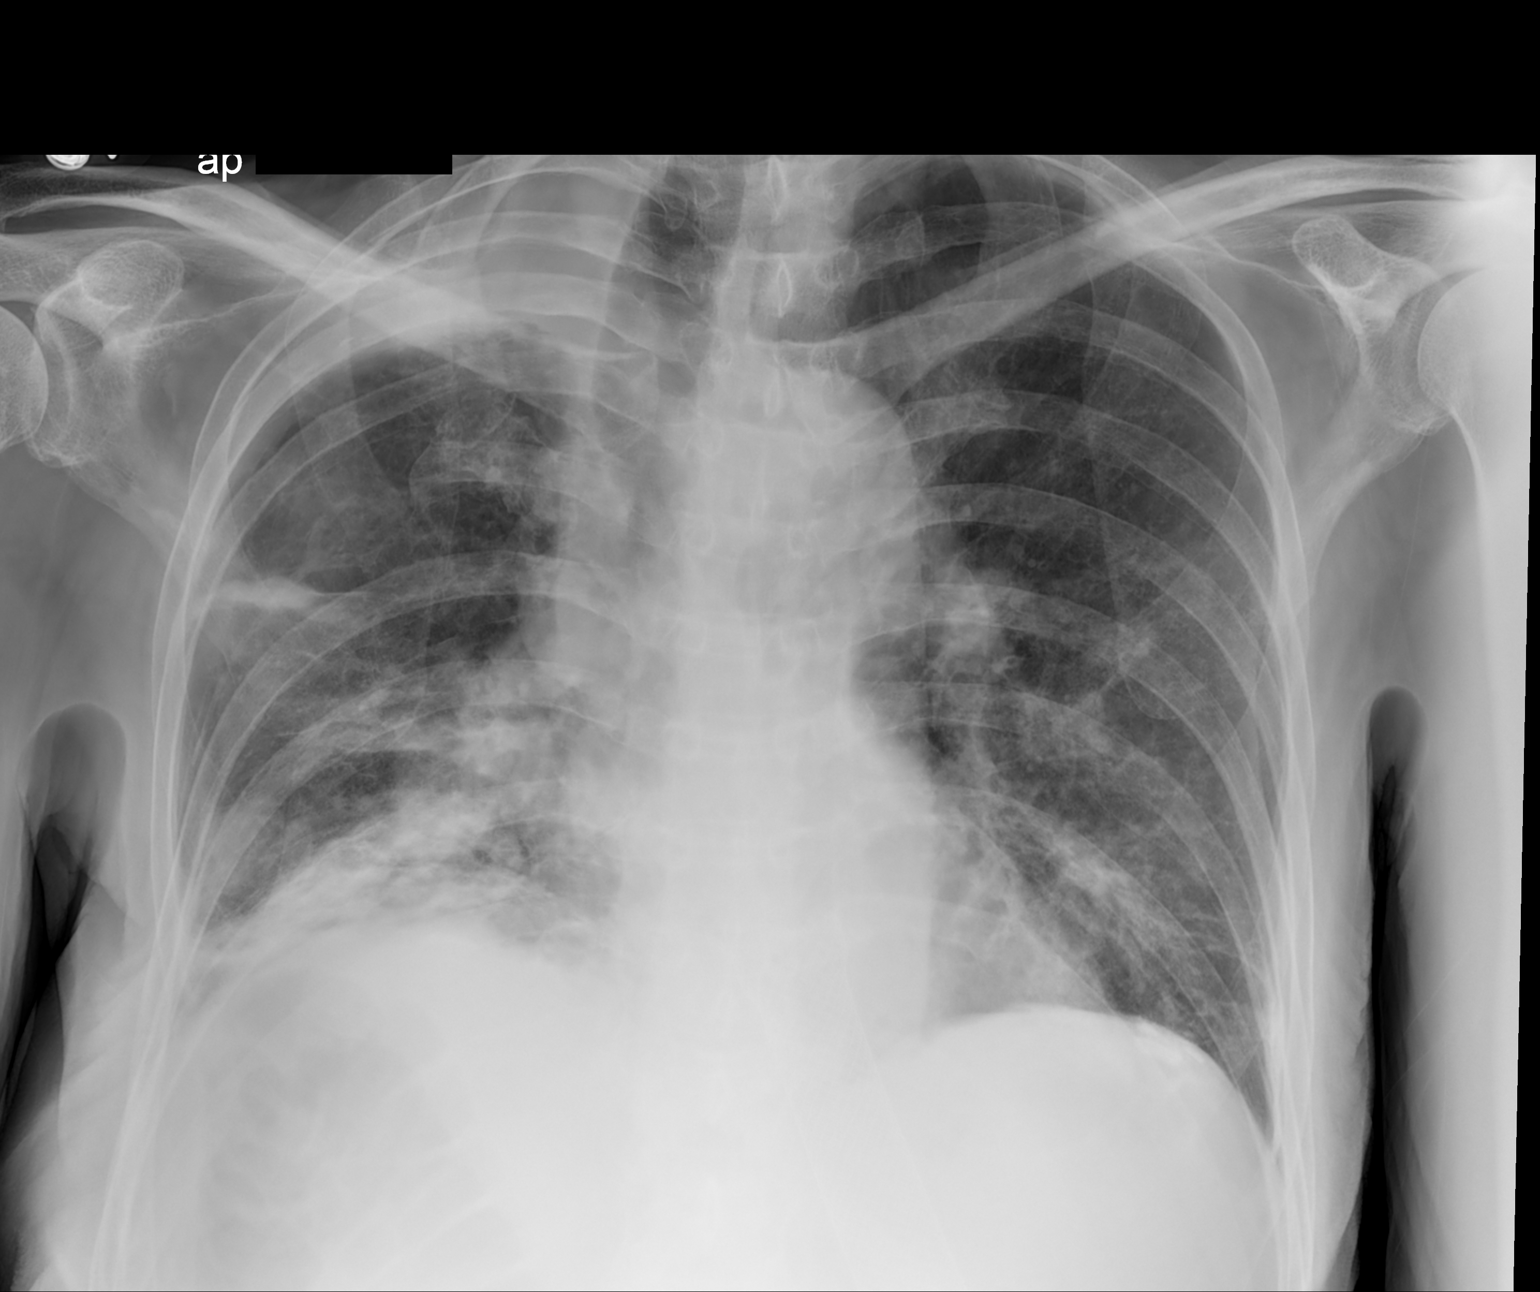

[1 of 1 positions shown; findings below may reference images not displayed]

FINDINGS: Right pleural thickening and masses are again identified.

Atelectasis/scarring within the mid and lower right lung are
identified.

The cardiomediastinal silhouette is unchanged.

There is no evidence of pleural effusion or pneumothorax.
IMPRESSION: Little significant change since recent CT. Right pleural thickening/
masses and right mid and lower lung atelectasis/scarring again
noted.
